# Patient Record
Sex: Female | Born: 1945 | Race: White | Hispanic: No | Smoking: Current every day smoker
Health system: Southern US, Community
[De-identification: ages and names within clinical notes are randomized; demographics above are authoritative.]

## PROBLEM LIST (undated history)

## (undated) DIAGNOSIS — J449 Chronic obstructive pulmonary disease, unspecified: Secondary | ICD-10-CM

## (undated) DIAGNOSIS — C641 Malignant neoplasm of right kidney, except renal pelvis: Secondary | ICD-10-CM

## (undated) DIAGNOSIS — I10 Essential (primary) hypertension: Secondary | ICD-10-CM

## (undated) DIAGNOSIS — I639 Cerebral infarction, unspecified: Secondary | ICD-10-CM

## (undated) DIAGNOSIS — F419 Anxiety disorder, unspecified: Secondary | ICD-10-CM

## (undated) DIAGNOSIS — J189 Pneumonia, unspecified organism: Secondary | ICD-10-CM

## (undated) DIAGNOSIS — M1990 Unspecified osteoarthritis, unspecified site: Secondary | ICD-10-CM

## (undated) DIAGNOSIS — F32A Depression, unspecified: Secondary | ICD-10-CM

## (undated) DIAGNOSIS — C189 Malignant neoplasm of colon, unspecified: Secondary | ICD-10-CM

## (undated) HISTORY — DX: Depression, unspecified: F32.A

## (undated) HISTORY — PX: NEPHRECTOMY: SHX65

## (undated) HISTORY — PX: CHOLECYSTECTOMY: SHX55

## (undated) HISTORY — PX: EYE SURGERY: SHX253

## (undated) HISTORY — PX: COLECTOMY: SHX59

## (undated) HISTORY — PX: CORONARY ANGIOPLASTY: SHX604

## (undated) HISTORY — PX: APPENDECTOMY: SHX54

---

## 2012-04-23 DIAGNOSIS — I2510 Atherosclerotic heart disease of native coronary artery without angina pectoris: Secondary | ICD-10-CM

## 2012-04-23 HISTORY — DX: Atherosclerotic heart disease of native coronary artery without angina pectoris: I25.10

## 2013-04-23 DIAGNOSIS — I219 Acute myocardial infarction, unspecified: Secondary | ICD-10-CM

## 2013-04-23 HISTORY — DX: Acute myocardial infarction, unspecified: I21.9

## 2019-12-23 DIAGNOSIS — K56609 Unspecified intestinal obstruction, unspecified as to partial versus complete obstruction: Secondary | ICD-10-CM

## 2019-12-23 HISTORY — DX: Unspecified intestinal obstruction, unspecified as to partial versus complete obstruction: K56.609

## 2020-01-05 ENCOUNTER — Other Ambulatory Visit: Payer: Self-pay

## 2020-01-05 ENCOUNTER — Inpatient Hospital Stay (HOSPITAL_COMMUNITY)
Admission: EM | Admit: 2020-01-05 | Discharge: 2020-01-12 | DRG: 395 | Disposition: A | Discharge status: Home / Self Care | Payer: Medicare Other | Attending: Family Medicine | Admitting: Family Medicine

## 2020-01-05 ENCOUNTER — Encounter (HOSPITAL_COMMUNITY): Payer: Self-pay | Admitting: Emergency Medicine

## 2020-01-05 DIAGNOSIS — K450 Other specified abdominal hernia with obstruction, without gangrene: Secondary | ICD-10-CM

## 2020-01-05 DIAGNOSIS — G8929 Other chronic pain: Secondary | ICD-10-CM | POA: Diagnosis present

## 2020-01-05 DIAGNOSIS — Z91041 Radiographic dye allergy status: Secondary | ICD-10-CM

## 2020-01-05 DIAGNOSIS — F419 Anxiety disorder, unspecified: Secondary | ICD-10-CM | POA: Diagnosis present

## 2020-01-05 DIAGNOSIS — E785 Hyperlipidemia, unspecified: Secondary | ICD-10-CM | POA: Diagnosis present

## 2020-01-05 DIAGNOSIS — C189 Malignant neoplasm of colon, unspecified: Secondary | ICD-10-CM | POA: Diagnosis present

## 2020-01-05 DIAGNOSIS — F17210 Nicotine dependence, cigarettes, uncomplicated: Secondary | ICD-10-CM | POA: Diagnosis present

## 2020-01-05 DIAGNOSIS — F17200 Nicotine dependence, unspecified, uncomplicated: Secondary | ICD-10-CM | POA: Diagnosis present

## 2020-01-05 DIAGNOSIS — Z85038 Personal history of other malignant neoplasm of large intestine: Secondary | ICD-10-CM

## 2020-01-05 DIAGNOSIS — I10 Essential (primary) hypertension: Secondary | ICD-10-CM | POA: Diagnosis present

## 2020-01-05 DIAGNOSIS — C641 Malignant neoplasm of right kidney, except renal pelvis: Secondary | ICD-10-CM | POA: Diagnosis present

## 2020-01-05 DIAGNOSIS — Z85528 Personal history of other malignant neoplasm of kidney: Secondary | ICD-10-CM

## 2020-01-05 DIAGNOSIS — J449 Chronic obstructive pulmonary disease, unspecified: Secondary | ICD-10-CM | POA: Diagnosis present

## 2020-01-05 DIAGNOSIS — I2510 Atherosclerotic heart disease of native coronary artery without angina pectoris: Secondary | ICD-10-CM | POA: Diagnosis present

## 2020-01-05 DIAGNOSIS — Z905 Acquired absence of kidney: Secondary | ICD-10-CM

## 2020-01-05 DIAGNOSIS — Z66 Do not resuscitate: Secondary | ICD-10-CM | POA: Diagnosis present

## 2020-01-05 DIAGNOSIS — Z7952 Long term (current) use of systemic steroids: Secondary | ICD-10-CM

## 2020-01-05 DIAGNOSIS — F418 Other specified anxiety disorders: Secondary | ICD-10-CM | POA: Diagnosis present

## 2020-01-05 DIAGNOSIS — Z8673 Personal history of transient ischemic attack (TIA), and cerebral infarction without residual deficits: Secondary | ICD-10-CM

## 2020-01-05 DIAGNOSIS — Z20822 Contact with and (suspected) exposure to covid-19: Secondary | ICD-10-CM | POA: Diagnosis present

## 2020-01-05 DIAGNOSIS — K420 Umbilical hernia with obstruction, without gangrene: Principal | ICD-10-CM | POA: Diagnosis present

## 2020-01-05 DIAGNOSIS — M545 Low back pain: Secondary | ICD-10-CM | POA: Diagnosis present

## 2020-01-05 DIAGNOSIS — K56609 Unspecified intestinal obstruction, unspecified as to partial versus complete obstruction: Secondary | ICD-10-CM | POA: Diagnosis present

## 2020-01-05 DIAGNOSIS — Z0189 Encounter for other specified special examinations: Secondary | ICD-10-CM

## 2020-01-05 DIAGNOSIS — I639 Cerebral infarction, unspecified: Secondary | ICD-10-CM | POA: Diagnosis present

## 2020-01-05 DIAGNOSIS — I252 Old myocardial infarction: Secondary | ICD-10-CM

## 2020-01-05 HISTORY — DX: Cerebral infarction, unspecified: I63.9

## 2020-01-05 HISTORY — DX: Essential (primary) hypertension: I10

## 2020-01-05 HISTORY — DX: Malignant neoplasm of right kidney, except renal pelvis: C64.1

## 2020-01-05 HISTORY — DX: Malignant neoplasm of colon, unspecified: C18.9

## 2020-01-05 HISTORY — DX: Anxiety disorder, unspecified: F41.9

## 2020-01-05 HISTORY — DX: Chronic obstructive pulmonary disease, unspecified: J44.9

## 2020-01-05 NOTE — ED Triage Notes (Signed)
Patient arrived with EMS from home reports mid/upper abdominal pain with nausea and bulging abdominal hernia this morning . No emesis or diarrhea , denies fever or chills .

## 2020-01-06 ENCOUNTER — Emergency Department (HOSPITAL_COMMUNITY): Payer: Medicare Other

## 2020-01-06 ENCOUNTER — Inpatient Hospital Stay (HOSPITAL_COMMUNITY): Payer: Medicare Other

## 2020-01-06 ENCOUNTER — Encounter (HOSPITAL_COMMUNITY): Payer: Self-pay | Admitting: Internal Medicine

## 2020-01-06 DIAGNOSIS — E785 Hyperlipidemia, unspecified: Secondary | ICD-10-CM | POA: Diagnosis present

## 2020-01-06 DIAGNOSIS — Z01810 Encounter for preprocedural cardiovascular examination: Secondary | ICD-10-CM

## 2020-01-06 DIAGNOSIS — K56609 Unspecified intestinal obstruction, unspecified as to partial versus complete obstruction: Secondary | ICD-10-CM

## 2020-01-06 DIAGNOSIS — I252 Old myocardial infarction: Secondary | ICD-10-CM | POA: Diagnosis not present

## 2020-01-06 DIAGNOSIS — I10 Essential (primary) hypertension: Secondary | ICD-10-CM | POA: Diagnosis present

## 2020-01-06 DIAGNOSIS — J449 Chronic obstructive pulmonary disease, unspecified: Secondary | ICD-10-CM | POA: Diagnosis present

## 2020-01-06 DIAGNOSIS — G8929 Other chronic pain: Secondary | ICD-10-CM | POA: Diagnosis present

## 2020-01-06 DIAGNOSIS — Z66 Do not resuscitate: Secondary | ICD-10-CM | POA: Diagnosis present

## 2020-01-06 DIAGNOSIS — C641 Malignant neoplasm of right kidney, except renal pelvis: Secondary | ICD-10-CM | POA: Diagnosis present

## 2020-01-06 DIAGNOSIS — I2510 Atherosclerotic heart disease of native coronary artery without angina pectoris: Secondary | ICD-10-CM

## 2020-01-06 DIAGNOSIS — F32A Depression, unspecified: Secondary | ICD-10-CM | POA: Diagnosis present

## 2020-01-06 DIAGNOSIS — K420 Umbilical hernia with obstruction, without gangrene: Secondary | ICD-10-CM | POA: Diagnosis present

## 2020-01-06 DIAGNOSIS — F17200 Nicotine dependence, unspecified, uncomplicated: Secondary | ICD-10-CM | POA: Diagnosis not present

## 2020-01-06 DIAGNOSIS — Z85038 Personal history of other malignant neoplasm of large intestine: Secondary | ICD-10-CM | POA: Diagnosis not present

## 2020-01-06 DIAGNOSIS — F17210 Nicotine dependence, cigarettes, uncomplicated: Secondary | ICD-10-CM | POA: Diagnosis present

## 2020-01-06 DIAGNOSIS — R1033 Periumbilical pain: Secondary | ICD-10-CM | POA: Diagnosis present

## 2020-01-06 DIAGNOSIS — Z905 Acquired absence of kidney: Secondary | ICD-10-CM | POA: Diagnosis not present

## 2020-01-06 DIAGNOSIS — Z20822 Contact with and (suspected) exposure to covid-19: Secondary | ICD-10-CM | POA: Diagnosis present

## 2020-01-06 DIAGNOSIS — Z8673 Personal history of transient ischemic attack (TIA), and cerebral infarction without residual deficits: Secondary | ICD-10-CM | POA: Diagnosis not present

## 2020-01-06 DIAGNOSIS — Z7952 Long term (current) use of systemic steroids: Secondary | ICD-10-CM | POA: Diagnosis not present

## 2020-01-06 DIAGNOSIS — F418 Other specified anxiety disorders: Secondary | ICD-10-CM | POA: Diagnosis present

## 2020-01-06 DIAGNOSIS — M545 Low back pain: Secondary | ICD-10-CM | POA: Diagnosis present

## 2020-01-06 DIAGNOSIS — I639 Cerebral infarction, unspecified: Secondary | ICD-10-CM | POA: Diagnosis present

## 2020-01-06 DIAGNOSIS — Z91041 Radiographic dye allergy status: Secondary | ICD-10-CM | POA: Diagnosis not present

## 2020-01-06 DIAGNOSIS — Z85528 Personal history of other malignant neoplasm of kidney: Secondary | ICD-10-CM | POA: Diagnosis not present

## 2020-01-06 DIAGNOSIS — F419 Anxiety disorder, unspecified: Secondary | ICD-10-CM | POA: Diagnosis not present

## 2020-01-06 DIAGNOSIS — C189 Malignant neoplasm of colon, unspecified: Secondary | ICD-10-CM | POA: Diagnosis present

## 2020-01-06 LAB — COMPREHENSIVE METABOLIC PANEL
ALT: 12 U/L (ref 0–44)
AST: 17 U/L (ref 15–41)
Albumin: 3.9 g/dL (ref 3.5–5.0)
Alkaline Phosphatase: 76 U/L (ref 38–126)
Anion gap: 10 (ref 5–15)
BUN: 13 mg/dL (ref 8–23)
CO2: 28 mmol/L (ref 22–32)
Calcium: 9.5 mg/dL (ref 8.9–10.3)
Chloride: 103 mmol/L (ref 98–111)
Creatinine, Ser: 0.85 mg/dL (ref 0.44–1.00)
GFR calc Af Amer: >60 mL/min (ref >60)
GFR calc non Af Amer: >60 mL/min (ref >60)
Glucose, Bld: 121 mg/dL — ABNORMAL HIGH (ref 70–99)
Potassium: 3.9 mmol/L (ref 3.5–5.1)
Sodium: 141 mmol/L (ref 135–145)
Total Bilirubin: 0.8 mg/dL (ref 0.3–1.2)
Total Protein: 7.4 g/dL (ref 6.5–8.1)

## 2020-01-06 LAB — CBC
HCT: 44.3 % (ref 36.0–46.0)
Hemoglobin: 14.1 g/dL (ref 12.0–15.0)
MCH: 32.3 pg (ref 26.0–34.0)
MCHC: 31.8 g/dL (ref 30.0–36.0)
MCV: 101.4 fL — ABNORMAL HIGH (ref 80.0–100.0)
Platelets: 380 10*3/uL (ref 150–400)
RBC: 4.37 MIL/uL (ref 3.87–5.11)
RDW: 12.9 % (ref 11.5–15.5)
WBC: 11.4 10*3/uL — ABNORMAL HIGH (ref 4.0–10.5)
nRBC: 0 % (ref 0.0–0.2)

## 2020-01-06 LAB — URINALYSIS, ROUTINE W REFLEX MICROSCOPIC
Bilirubin Urine: NEGATIVE
Glucose, UA: NEGATIVE mg/dL
Hgb urine dipstick: NEGATIVE
Ketones, ur: 5 mg/dL — AB
Leukocytes,Ua: NEGATIVE
Nitrite: NEGATIVE
Protein, ur: 30 mg/dL — AB
Specific Gravity, Urine: 1.027 (ref 1.005–1.030)
pH: 5 (ref 5.0–8.0)

## 2020-01-06 LAB — LACTIC ACID, PLASMA: Lactic Acid, Venous: 1.1 mmol/L (ref 0.5–1.9)

## 2020-01-06 LAB — SARS CORONAVIRUS 2 BY RT PCR (HOSPITAL ORDER, PERFORMED IN ~~LOC~~ HOSPITAL LAB): SARS Coronavirus 2: NEGATIVE

## 2020-01-06 LAB — BRAIN NATRIURETIC PEPTIDE: B Natriuretic Peptide: 90.6 pg/mL (ref 0.0–100.0)

## 2020-01-06 LAB — LIPASE, BLOOD: Lipase: 23 U/L (ref 11–51)

## 2020-01-06 MED ORDER — MORPHINE SULFATE (PF) 2 MG/ML IV SOLN: 2.0000 mg | INTRAVENOUS | Status: DC | PRN

## 2020-01-06 MED ORDER — ALBUTEROL SULFATE (2.5 MG/3ML) 0.083% IN NEBU: 3.0000 mL | INHALATION_SOLUTION | RESPIRATORY_TRACT | Status: DC | PRN

## 2020-01-06 MED ORDER — LORAZEPAM 2 MG/ML IJ SOLN
0.5000 mg | INTRAMUSCULAR | Status: DC | PRN
  Administered 2020-01-07: 0.5 mg via INTRAVENOUS
  Filled 2020-01-06: qty 1

## 2020-01-06 MED ORDER — FENTANYL CITRATE (PF) 100 MCG/2ML IJ SOLN
50.0000 ug | Freq: Once | INTRAMUSCULAR | Status: DC
  Filled 2020-01-06: qty 2

## 2020-01-06 MED ORDER — ONDANSETRON HCL 4 MG/2ML IJ SOLN
4.0000 mg | Freq: Four times a day (QID) | INTRAMUSCULAR | Status: DC | PRN
  Administered 2020-01-06 – 2020-01-09 (×2): 4 mg via INTRAVENOUS
  Filled 2020-01-06 (×2): qty 2

## 2020-01-06 MED ORDER — ONDANSETRON 4 MG PO TBDP
4.0000 mg | ORAL_TABLET | Freq: Four times a day (QID) | ORAL | Status: DC | PRN
  Administered 2020-01-09: 4 mg via ORAL
  Filled 2020-01-06: qty 1

## 2020-01-06 MED ORDER — PHENOL 1.4 % MT LIQD
1.0000 | OROMUCOSAL | Status: DC | PRN
  Administered 2020-01-06: 1 via OROMUCOSAL
  Filled 2020-01-06: qty 177

## 2020-01-06 MED ORDER — ACETAMINOPHEN 325 MG PO TABS
650.0000 mg | ORAL_TABLET | Freq: Four times a day (QID) | ORAL | Status: DC | PRN
  Administered 2020-01-07: 650 mg via ORAL
  Administered 2020-01-08: 325 mg via ORAL
  Administered 2020-01-10 – 2020-01-12 (×3): 650 mg via ORAL
  Filled 2020-01-06 (×6): qty 2

## 2020-01-06 MED ORDER — LACTATED RINGERS IV SOLN: INTRAVENOUS | Status: DC

## 2020-01-06 MED ORDER — ONDANSETRON HCL 4 MG/2ML IJ SOLN
4.0000 mg | Freq: Once | INTRAMUSCULAR | Status: AC
  Administered 2020-01-06: 4 mg via INTRAVENOUS
  Filled 2020-01-06: qty 2

## 2020-01-06 MED ORDER — HYDRALAZINE HCL 20 MG/ML IJ SOLN
10.0000 mg | INTRAMUSCULAR | Status: DC | PRN
  Administered 2020-01-11 – 2020-01-12 (×2): 10 mg via INTRAVENOUS
  Filled 2020-01-06 (×2): qty 1

## 2020-01-06 MED ORDER — POLYVINYL ALCOHOL 1.4 % OP SOLN
1.0000 [drp] | Freq: Every day | OPHTHALMIC | Status: DC
  Administered 2020-01-06 – 2020-01-12 (×6): 1 [drp] via OPHTHALMIC
  Filled 2020-01-06: qty 15

## 2020-01-06 MED ORDER — FLUTICASONE FUROATE-VILANTEROL 100-25 MCG/INH IN AEPB
1.0000 | INHALATION_SPRAY | Freq: Every day | RESPIRATORY_TRACT | Status: DC
  Filled 2020-01-06 (×2): qty 28

## 2020-01-06 MED ORDER — ACETAMINOPHEN 650 MG RE SUPP
650.0000 mg | Freq: Four times a day (QID) | RECTAL | Status: DC | PRN
  Administered 2020-01-07 (×2): 650 mg via RECTAL
  Filled 2020-01-06 (×2): qty 1

## 2020-01-06 MED ORDER — NICOTINE 21 MG/24HR TD PT24
21.0000 mg | MEDICATED_PATCH | Freq: Every day | TRANSDERMAL | Status: DC
  Filled 2020-01-06 (×5): qty 1

## 2020-01-06 NOTE — Consult Note (Addendum)
Christine Stuart 07/19/1945  [id number].    Requesting MD: Dr. Sherry Ruffing Chief Complaints: Abdominal pain, n/v Chief Complaint/Reason for Consult: SBO 2/2 periumbilical hernia   HPI: Christine Stuart is a 74 y.o. female with hx of CAD (prior stenting 2015), HTN, HLD, chronic low back pain on chronic steroids, chronic bronchitis, tobacco abuse who presented with acute onset of mid/upper abdominal pain.Patient reports this morning she began having severe, constant, mid/upper abdominal pain secondary to unreducible hernia.  She reports associated nausea without emesis.  Patient reported to [facility] for evaluation. She has noted increased frequency of hernia popping out but she is usually able to push it back in on her own. She has mild relief with IV pain medication. She underwent a CT A/P that showed severe proximal small bowel dilatation c/w obstruction which appears 2/2 large periumbilical hernia. More distal small bowel is non-dilated. This was unable to be reduced in the ED. General surgery was asked to see.     Patient has history of prior cholecystectomy, appendectomy, right nephrectomy, partial colectomy and tubal ligation. She is not on blood thinners. Patient reports she lives alone and pretty much all of her family is deceased. She has a daughter in law that she asked if I would update.   Patient reports allergy to almost all antibiotics, but reports she is able to take cipro.  ROS: Review of Systems  Constitutional: Negative for chills and fever.  Respiratory: Negative for shortness of breath and wheezing.   Cardiovascular: Negative for chest pain and palpitations.  Gastrointestinal: Positive for abdominal pain, constipation, nausea and vomiting.  Genitourinary: Negative for dysuria, frequency and urgency.  Musculoskeletal: Positive for back pain (chronic).  All other systems reviewed and are negative.   No family history on file.  History reviewed. No pertinent past medical  history.  History reviewed. No pertinent surgical history.  Social History:  reports that she has never smoked. She has never used smokeless tobacco. She reports that she does not drink alcohol and does not use drugs.  Allergies: No Known Allergies  (Not in a hospital admission)    Physical Exam: Blood pressure (!) 148/57, pulse 71, temperature 99.2 F (37.3 C), temperature source Oral, resp. rate 20, height 5' (1.524 m), weight 62 kg, SpO2 95 %. General: tearful, WD/WN white female who is laying in bed in NAD HEENT: head is normocephalic, atraumatic.  Sclera are noninjected.  PERRL.  Ears and nose without any masses or lesions.  Mouth is pink and moist. Dentition fair Heart: regular, rate, and rhythm.  Normal s1,s2. No obvious murmurs, gallops, or rubs noted.  Palpable pedal pulses bilaterally  Lungs: CTAB, no wheezes, rhonchi, or rales noted.  Respiratory effort nonlabored Abd: Soft, distension noted. Periumbilical hernia noted that is tender. The hernia is reducible but spontaneously returns. No peritonitis. Hypoactive bowel sounds. No masses organomegaly. Prior abdominal scars are well healed.  MS: no BUE/BLE edema, calves soft and nontender Skin: warm and dry with no masses, lesions, or rashes Psych: A&Ox4 with an appropriate affect Neuro: cranial nerves grossly intact, equal strength in BUE/BLE bilaterally, normal speech, though process intact  Results for orders placed or performed during the hospital encounter of 01/05/20 (from the past 48 hour(s))  Lipase, blood     Status: None   Collection Time: 01/06/20 12:05 AM  Result Value Ref Range   Lipase 23 11 - 51 U/L    Comment: Performed at [facility], [place] [address] [place], [place]  [address]  Comprehensive metabolic panel     Status: Abnormal   Collection Time: 01/06/20 12:05 AM  Result Value Ref Range   Sodium 141 135 - 145 mmol/L   Potassium 3.9 3.5 - 5.1 mmol/L   Chloride 103 98 - 111 mmol/L   CO2 28 22 - 32  mmol/L   Glucose, Bld 121 (H) 70 - 99 mg/dL    Comment: Glucose reference range applies only to samples taken after fasting for at least 8 hours.   BUN 13 8 - 23 mg/dL   Creatinine, Ser 0.85 0.44 - 1.00 mg/dL   Calcium 9.5 8.9 - 10.3 mg/dL   Total Protein 7.4 6.5 - 8.1 g/dL   Albumin 3.9 3.5 - 5.0 g/dL   AST 17 15 - 41 U/L   ALT 12 0 - 44 U/L   Alkaline Phosphatase 76 38 - 126 U/L   Total Bilirubin 0.8 0.3 - 1.2 mg/dL   GFR calc non Af Amer >60 >60 mL/min   GFR calc Af Amer >60 >60 mL/min   Anion gap 10 5 - 15    Comment: Performed at [facility], [place] [address] [place], [place] [address]  CBC     Status: Abnormal   Collection Time: 01/06/20 12:05 AM  Result Value Ref Range   WBC 11.4 (H) 4.0 - 10.5 K/uL   RBC 4.37 3.87 - 5.11 MIL/uL   Hemoglobin 14.1 12.0 - 15.0 g/dL   HCT 44.3 36 - 46 %   MCV 101.4 (H) 80.0 - 100.0 fL   MCH 32.3 26.0 - 34.0 pg   MCHC 31.8 30.0 - 36.0 g/dL   RDW 12.9 11.5 - 15.5 %   Platelets 380 150 - 400 K/uL   nRBC 0.0 0.0 - 0.2 %    Comment: Performed at [facility], [place] [address] [place], [place] [address]  Urinalysis, Routine w reflex microscopic     Status: Abnormal   Collection Time: 01/06/20  1:30 PM  Result Value Ref Range   Color, Urine AMBER (A) YELLOW    Comment: BIOCHEMICALS MAY BE AFFECTED BY COLOR   APPearance HAZY (A) CLEAR   Specific Gravity, Urine 1.027 1.005 - 1.030   pH 5.0 5.0 - 8.0   Glucose, UA NEGATIVE NEGATIVE mg/dL   Hgb urine dipstick NEGATIVE NEGATIVE   Bilirubin Urine NEGATIVE NEGATIVE   Ketones, ur 5 (A) NEGATIVE mg/dL   Protein, ur 30 (A) NEGATIVE mg/dL   Nitrite NEGATIVE NEGATIVE   Leukocytes,Ua NEGATIVE NEGATIVE   RBC / HPF 0-5 0 - 5 RBC/hpf   WBC, UA 6-10 0 - 5 WBC/hpf   Bacteria, UA FEW (A) NONE SEEN   Squamous Epithelial / LPF 0-5 0 - 5   Mucus PRESENT     Comment: Performed at [facility], [address] [place], [place] [address]  Lactic acid, plasma     Status: None   Collection  Time: 01/06/20  2:27 PM  Result Value Ref Range   Lactic Acid, Venous 1.1 0.5 - 1.9 mmol/L    Comment: Performed at [facility] [address] [place], [place] [address]  SARS Coronavirus 2 by RT PCR (hospital order, performed in [facility] hospital lab) Nasopharyngeal Nasopharyngeal Swab     Status: None   Collection Time: 01/06/20  2:28 PM   Specimen: Nasopharyngeal Swab  Result Value Ref Range   SARS Coronavirus 2 NEGATIVE NEGATIVE    Comment: (NOTE) SARS-CoV-2 target  nucleic acids are NOT DETECTED.  The SARS-CoV-2 RNA is generally detectable in upper and lower respiratory specimens during the acute phase of infection. The lowest concentration of SARS-CoV-2 viral copies this assay can detect is 250 copies / mL. A negative result does not preclude SARS-CoV-2 infection and should not be used as the sole basis for treatment or other patient management decisions.  A negative result may occur with improper specimen collection / handling, submission of specimen other than nasopharyngeal swab, presence of viral mutation(s) within the areas targeted by this assay, and inadequate number of viral copies (<250 copies / mL). A negative result must be combined with clinical observations, patient history, and epidemiological information.  Fact Sheet for Patients:   StrictlyIdeas.no  Fact Sheet for Healthcare Providers: BankingDealers.co.za  This test is not yet approved or  cleared by the [place] FDA and has been authorized for detection and/or diagnosis of SARS-CoV-2 by FDA under an Emergency Use Authorization (EUA).  This EUA will remain in effect (meaning this test can be used) for the duration of the COVID-19 declaration under Section 564(b)(1) of the Act, 21 U.S.C. section 360bbb-3(b)(1), unless the authorization is terminated or revoked sooner.  Performed at [facility], [place] [address], [place], [place] [address]     CT ABDOMEN PELVIS WO CONTRAST  Result Date: 01/06/2020 CLINICAL DATA:  Abdominal pain. EXAM: CT ABDOMEN AND PELVIS WITHOUT CONTRAST TECHNIQUE: Multidetector CT imaging of the abdomen and pelvis was performed following the standard protocol without IV contrast. COMPARISON:  June 06, 2017. FINDINGS: Lower chest: No acute abnormality. Hepatobiliary: Status post cholecystectomy. No biliary dilatation is noted. Stable large right renal cyst is noted. Pancreas: Unremarkable. No pancreatic ductal dilatation or surrounding inflammatory changes. Spleen: Normal in size without focal abnormality. Adrenals/Urinary Tract: Stable bilateral adrenal adenomas. Status post right nephrectomy. Left kidney and ureter are unremarkable. No hydronephrosis or renal obstruction is noted. Urinary bladder is decompressed. Stomach/Bowel: The stomach appears normal. No colonic dilatation is noted. Postsurgical changes are seen involving the right colon. Severe proximal small bowel dilatation is noted which appears to be due to small bowel loops within the large periumbilical hernia resulting in obstruction. More distal small bowel loops are nondilated. Vascular/Lymphatic: Aortic atherosclerosis. No enlarged abdominal or pelvic lymph nodes. Reproductive: Uterus and bilateral adnexa are unremarkable. Other: No ascites is noted.  No abnormal fluid collection is noted. Musculoskeletal: No acute or significant osseous findings. IMPRESSION: 1. Severe proximal small bowel dilatation is noted which appears to be due to small bowel loops within the large periumbilical hernia resulting in obstruction. More distal small bowel loops are nondilated. 2. Stable bilateral adrenal adenomas. 3. Aortic atherosclerosis. Aortic Atherosclerosis (ICD10-I70.0). Electronically Signed   By: Marijo Conception M.D.   On: 01/06/2020 14:51    Anti-infectives (From admission, onward)   None      Assessment/Plan Hx of CAD (prior stenting 2015) - previously  followed at [facility] HTN HLD Chronic low back pain on chronic steroids Chronic bronchitis Tobacco abuse  History of prior cholecystectomy, appendectomy, right nephrectomy, partial colectomy and tubal ligation  SBO 2/2 large periumbilical hernia - Recommend medical admission for patients multiple underlying medical problems  - Patients hernia is reducible but spontaneously returns. Will discuss with MD about conservative management w/ NGT decompression and observation overnight vs OR tonight. Her lactic acid is 1.1. WBC 11.4. VS currently stable without tachycardia or hypotension. In the interm, recommend NPO, NGT decompression.  - Further recs to follow  FEN - NPO, NGT VTE - SCDs,  ID - None currently   Patient reports allergy to almost all antibiotics, but reports she is able to take cipro.  Barkley Boards, [facility] Surgery 01/06/2020, 3:54 PM Please see Amion for pager number during day hours 7:00am-4:30pm
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
## 2020-01-06 NOTE — Consult Note (Addendum)
Cardiology Consultation:   Patient ID: Christine Stuart MRN: [id number]; DOB: 08/25/1945  Admit date: 01/05/2020 Date of Consult: 01/06/2020  Primary Care Provider: Patient, No Pcp Per [facility] Cardiologist: No primary care provider on file. new; seen in Register Electrophysiologist:  None    Patient Profile:   Christine Stuart is a 74 y.o. female with a hx of CAD, s/p PCI in 2015 who is being seen today for the evaluation of preoperative clearance at the request of Dr. Lorin Mercy.  History of Present Illness:   Christine Stuart has a history of coronary artery disease based on the prior record.  Prior to her stent in 2015, she had exertional angina.  It resolved after her stent.  She has not had any further anginal symptoms since that time.  She has had a very stressful 1 to 2 years with the death of a son and her husband.  She has used cigarettes to help her manage stress.  She has oxygen that she uses at home as needed.  She walks up an incline to get to her mailbox.  She denies any chest discomfort with that activity.  She does have some dyspnea on exertion.  She does admit that she does not do any regular exercise.  There are no stairs in her house.  Overall, she remains independent and is able to complete all of her daily activities as needed.   Past Medical History:  Diagnosis Date  . Anxiety and depression   . CAD (coronary artery disease) 2014  . Cancer of kidney, right ([place])   . Colon cancer ([place])   . COPD (chronic obstructive pulmonary disease) ([place])   . CVA (cerebral vascular accident) ([place])   . Hypertension     Past Surgical History:  Procedure Laterality Date  . COLECTOMY    . NEPHRECTOMY         Inpatient Medications: Scheduled Meds: . nicotine  21 mg Transdermal Daily   Continuous Infusions: . lactated ringers     PRN Meds: acetaminophen **OR** acetaminophen, albuterol, hydrALAZINE, LORazepam, morphine injection, ondansetron **OR** ondansetron  (ZOFRAN) IV  Allergies:    Allergies  Allergen Reactions  . Codeine Swelling  . Erythromycin Hives, Itching, Shortness Of Breath and Swelling  . Iodinated Diagnostic Agents Hives, Itching, Shortness Of Breath and Swelling  . Other Swelling and Other (See Comments)    Propoxyphene N-acetaminophen  . Penicillins Hives, Itching, Shortness Of Breath and Swelling  . Sulfa Antibiotics Hives and Shortness Of Breath  . Influenza Vaccines Nausea And Vomiting and Other (See Comments)    Fever; Pt stated she was sick for days  . Naproxen Swelling and Other (See Comments)    Tongue and throat  . Oxycodone-Acetaminophen Swelling  . Pneumococcal Vaccine Nausea And Vomiting    Social History:   Social History   Socioeconomic History  . Marital status: Widowed    Spouse name: Not on file  . Number of children: Not on file  . Years of education: Not on file  . Highest education level: Not on file  Occupational History  . Not on file  Tobacco Use  . Smoking status: Current Every Day Smoker    Packs/day: 1.00    Years: 60.00    Pack years: 60.00  . Smokeless tobacco: Never Used  Substance and Sexual Activity  . Alcohol use: Never  . Drug use: Never  . Sexual activity: Not on file  Other Topics Concern  . Not on file  Social History Narrative  . Not on file   Social Determinants of Health   Financial Resource Strain:   . Difficulty of Paying Living Expenses: Not on file  Food Insecurity:   . Worried About Charity fundraiser in the Last Year: Not on file  . Ran Out of Food in the Last Year: Not on file  Transportation Needs:   . Lack of Transportation (Medical): Not on file  . Lack of Transportation (Non-Medical): Not on file  Physical Activity:   . Days of Exercise per Week: Not on file  . Minutes of Exercise per Session: Not on file  Stress:   . Feeling of Stress : Not on file  Social Connections:   . Frequency of Communication with Friends and Family: Not on file  .  Frequency of Social Gatherings with Friends and Family: Not on file  . Attends Religious Services: Not on file  . Active Member of Clubs or Organizations: Not on file  . Attends Archivist Meetings: Not on file  . Marital Status: Not on file  Intimate Partner Violence:   . Fear of Current or Ex-Partner: Not on file  . Emotionally Abused: Not on file  . Physically Abused: Not on file  . Sexually Abused: Not on file    Family History:   Mother with heart disease.  Maternal grandmother had colon cancer.  ROS:  Please see the history of present illness.  Significant for abdominal pain and chronic dyspnea on exertion All other ROS reviewed and negative.     Physical Exam/Data:   Vitals:   01/06/20 0941 01/06/20 1335 01/06/20 1400 01/06/20 1500  BP: (!) 157/57 (!) 157/60 (!) 161/52 (!) 148/57  Pulse: 66 84 77 71  Resp: 16 17 20 20   Temp: 99.2 F (37.3 C)     TempSrc: Oral     SpO2: 96% 98% 98% 95%  Weight:      Height:       No intake or output data in the 24 hours ending 01/06/20 1835 Last 3 Weights 01/05/2020  Weight (lbs) 136 lb 11 oz  Weight (kg) 62 kg     Body mass index is 26.69 kg/m.  General:  Well nourished, well developed, in no acute distress HEENT: normal Lymph: no adenopathy Neck: no JVD Endocrine:  No thryomegaly Vascular: No carotid bruits; FA pulses 2+ bilaterally without bruits  Cardiac:  normal S1, S2; RRR; no murmur  Lungs:  clear to auscultation bilaterally, no wheezing, rhonchi or rales  Abd: soft, nontender, no hepatomegaly  Ext: no edema Musculoskeletal:  No deformities, BUE and BLE strength normal and equal Skin: warm and dry  Neuro:  CNs 2-12 intact, no focal abnormalities noted Psych:  Normal affect   EKG:  The EKG was personally reviewed and demonstrates:  NSR, no ST segment changes Telemetry:  Telemetry was personally reviewed and demonstrates:  NSR   Relevant CV Studies: Prior cath records reviewed showed single vessel  stent  Laboratory Data:  High Sensitivity Troponin:  No results for input(s): TROPONINIHS in the last 720 hours.   Chemistry Recent Labs  Lab 01/06/20 0005  NA 141  K 3.9  CL 103  CO2 28  GLUCOSE 121*  BUN 13  CREATININE 0.85  CALCIUM 9.5  GFRNONAA >60  GFRAA >60  ANIONGAP 10    Recent Labs  Lab 01/06/20 0005  PROT 7.4  ALBUMIN 3.9  AST 17  ALT 12  ALKPHOS 76  BILITOT 0.8  Hematology Recent Labs  Lab 01/06/20 0005  WBC 11.4*  RBC 4.37  HGB 14.1  HCT 44.3  MCV 101.4*  MCH 32.3  MCHC 31.8  RDW 12.9  PLT 380   BNPNo results for input(s): BNP, PROBNP in the last 168 hours.  DDimer No results for input(s): DDIMER in the last 168 hours.   Radiology/Studies:  CT ABDOMEN PELVIS WO CONTRAST  Result Date: 01/06/2020 CLINICAL DATA:  Abdominal pain. EXAM: CT ABDOMEN AND PELVIS WITHOUT CONTRAST TECHNIQUE: Multidetector CT imaging of the abdomen and pelvis was performed following the standard protocol without IV contrast. COMPARISON:  June 06, 2017. FINDINGS: Lower chest: No acute abnormality. Hepatobiliary: Status post cholecystectomy. No biliary dilatation is noted. Stable large right renal cyst is noted. Pancreas: Unremarkable. No pancreatic ductal dilatation or surrounding inflammatory changes. Spleen: Normal in size without focal abnormality. Adrenals/Urinary Tract: Stable bilateral adrenal adenomas. Status post right nephrectomy. Left kidney and ureter are unremarkable. No hydronephrosis or renal obstruction is noted. Urinary bladder is decompressed. Stomach/Bowel: The stomach appears normal. No colonic dilatation is noted. Postsurgical changes are seen involving the right colon. Severe proximal small bowel dilatation is noted which appears to be due to small bowel loops within the large periumbilical hernia resulting in obstruction. More distal small bowel loops are nondilated. Vascular/Lymphatic: Aortic atherosclerosis. No enlarged abdominal or pelvic lymph nodes.  Reproductive: Uterus and bilateral adnexa are unremarkable. Other: No ascites is noted.  No abnormal fluid collection is noted. Musculoskeletal: No acute or significant osseous findings. IMPRESSION: 1. Severe proximal small bowel dilatation is noted which appears to be due to small bowel loops within the large periumbilical hernia resulting in obstruction. More distal small bowel loops are nondilated. 2. Stable bilateral adrenal adenomas. 3. Aortic atherosclerosis. Aortic Atherosclerosis (ICD10-I70.0). Electronically Signed   By: Marijo Conception M.D.   On: 01/06/2020 14:51          Assessment and Plan:   1. Preoperative evaluation: No signs of acute ischemia.  No signs of acute heart failure.  I discussed the case with Dr. Inda Merlin.  The surgery is urgent and will likely be done tomorrow.  No cardiac testing is needed at this time.  Given her age and prior history of heart problems, she has an intermediate risk of cardiac complications during the perioperative period,  approximately 5%.  At this point, nothing can really be done to lower that risk.  Will deal with any potential complications as they arise.  We will follow along. 2. Coronary artery disease: She has been only on aspirin of late for antiplatelet therapy.  She has not taken her aspirin in the last couple of days due to the stomach upset related to her small bowel obstruction.  Long-term, she will need her atorvastatin. 3. Hypertension: Once blood pressure stabilized after surgery, restart lisinopril.  Renal function appeared stable. 4. She needs to stop smoking.       For questions or updates, please contact [place] Please consult [email or web address] for contact info under    Signed, Larae Grooms, MD  01/06/2020 6:35 PM

## 2020-01-06 NOTE — ED Provider Notes (Signed)
[facility] EMERGENCY DEPARTMENT Provider Note   CSN: [id number] Arrival date & time: 01/05/20  2351     History Chief Complaint  Patient presents with  . Abdominal Pain    Christine Stuart is a 74 y.o. female.  The history is provided by the patient and medical records. No language interpreter was used.  Abdominal Pain Pain location:  Periumbilical Pain quality: aching and sharp   Pain quality: not bloating   Pain radiates to:  Does not radiate Pain severity:  Severe Onset quality:  Gradual Duration:  2 days Timing:  Constant Progression:  Worsening Chronicity:  New Context: previous surgery   Relieved by:  Nothing Worsened by:  Palpation Ineffective treatments:  None tried Associated symptoms: belching and nausea   Associated symptoms: no anorexia, no chest pain, no chills, no cough, no diarrhea, no dysuria, no fatigue, no fever, no flatus, no shortness of breath and no vomiting   Risk factors: multiple surgeries        History reviewed. No pertinent past medical history.  There are no problems to display for this patient.   History reviewed. No pertinent surgical history.   OB History   No obstetric history on file.     No family history on file.  Social History   Tobacco Use  . Smoking status: Never Smoker  . Smokeless tobacco: Never Used  Substance Use Topics  . Alcohol use: Never  . Drug use: Never    Home Medications Prior to Admission medications   Not on File    Allergies    Patient has no known allergies.  Review of Systems   Review of Systems  Constitutional: Negative for chills, diaphoresis, fatigue and fever.  HENT: Negative for congestion.   Eyes: Negative for visual disturbance.  Respiratory: Negative for cough, chest tightness, shortness of breath and wheezing.   Cardiovascular: Negative for chest pain and palpitations.  Gastrointestinal: Positive for abdominal distention, abdominal pain and nausea. Negative  for anorexia, diarrhea, flatus and vomiting.  Genitourinary: Negative for dysuria and frequency.  Musculoskeletal: Negative for back pain, neck pain and neck stiffness.  Skin: Negative for wound.  Neurological: Negative for headaches.  Psychiatric/Behavioral: Negative for agitation.  All other systems reviewed and are negative.   Physical Exam Updated Vital Signs BP (!) 157/60   Pulse 84   Temp 99.2 F (37.3 C) (Oral)   Resp 17   Ht 5' (1.524 m)   Wt 62 kg   SpO2 98%   BMI 26.69 kg/m   Physical Exam Vitals and nursing note reviewed.  Constitutional:      General: She is not in acute distress.    Appearance: She is well-developed. She is not ill-appearing, toxic-appearing or diaphoretic.  HENT:     Head: Normocephalic and atraumatic.     Right Ear: External ear normal.     Left Ear: External ear normal.     Nose: Nose normal.     Mouth/Throat:     Mouth: Mucous membranes are moist.     Pharynx: No pharyngeal swelling or oropharyngeal exudate.  Eyes:     Extraocular Movements: Extraocular movements intact.     Conjunctiva/sclera: Conjunctivae normal.     Pupils: Pupils are equal, round, and reactive to light.  Cardiovascular:     Rate and Rhythm: Normal rate and regular rhythm.     Heart sounds: Normal heart sounds. No murmur heard.   Pulmonary:     Effort: Pulmonary effort is normal.  No respiratory distress.     Breath sounds: No stridor. No wheezing, rhonchi or rales.  Chest:     Chest wall: No tenderness.  Abdominal:     General: Bowel sounds are normal. There is distension.     Tenderness: There is abdominal tenderness. There is guarding. There is no right CVA tenderness, left CVA tenderness or rebound.     Hernia: A hernia is present. Hernia is present in the ventral area.    Musculoskeletal:     Cervical back: Normal range of motion and neck supple.  Skin:    General: Skin is warm.     Findings: No erythema or rash.  Neurological:     General: No focal  deficit present.     Mental Status: She is alert and oriented to person, place, and time.     Motor: No abnormal muscle tone.     Coordination: Coordination normal.     Deep Tendon Reflexes: Reflexes are normal and symmetric.  Psychiatric:        Mood and Affect: Mood normal.     ED Results / Procedures / Treatments   Labs (all labs ordered are listed, but only abnormal results are displayed) Labs Reviewed  COMPREHENSIVE METABOLIC PANEL - Abnormal; Notable for the following components:      Result Value   Glucose, Bld 121 (*)    All other components within normal limits  CBC - Abnormal; Notable for the following components:   WBC 11.4 (*)    MCV 101.4 (*)    All other components within normal limits  URINALYSIS, ROUTINE W REFLEX MICROSCOPIC - Abnormal; Notable for the following components:   Color, Urine AMBER (*)    APPearance HAZY (*)    Ketones, ur 5 (*)    Protein, ur 30 (*)    Bacteria, UA FEW (*)    All other components within normal limits  SARS CORONAVIRUS 2 BY RT PCR (HOSPITAL ORDER, [facility])  LIPASE, BLOOD  LACTIC ACID, PLASMA  BASIC METABOLIC PANEL  CBC    EKG None  Radiology CT ABDOMEN PELVIS WO CONTRAST  Result Date: 01/06/2020 CLINICAL DATA:  Abdominal pain. EXAM: CT ABDOMEN AND PELVIS WITHOUT CONTRAST TECHNIQUE: Multidetector CT imaging of the abdomen and pelvis was performed following the standard protocol without IV contrast. COMPARISON:  June 06, 2017. FINDINGS: Lower chest: No acute abnormality. Hepatobiliary: Status post cholecystectomy. No biliary dilatation is noted. Stable large right renal cyst is noted. Pancreas: Unremarkable. No pancreatic ductal dilatation or surrounding inflammatory changes. Spleen: Normal in size without focal abnormality. Adrenals/Urinary Tract: Stable bilateral adrenal adenomas. Status post right nephrectomy. Left kidney and ureter are unremarkable. No hydronephrosis or renal obstruction is  noted. Urinary bladder is decompressed. Stomach/Bowel: The stomach appears normal. No colonic dilatation is noted. Postsurgical changes are seen involving the right colon. Severe proximal small bowel dilatation is noted which appears to be due to small bowel loops within the large periumbilical hernia resulting in obstruction. More distal small bowel loops are nondilated. Vascular/Lymphatic: Aortic atherosclerosis. No enlarged abdominal or pelvic lymph nodes. Reproductive: Uterus and bilateral adnexa are unremarkable. Other: No ascites is noted.  No abnormal fluid collection is noted. Musculoskeletal: No acute or significant osseous findings. IMPRESSION: 1. Severe proximal small bowel dilatation is noted which appears to be due to small bowel loops within the large periumbilical hernia resulting in obstruction. More distal small bowel loops are nondilated. 2. Stable bilateral adrenal adenomas. 3. Aortic atherosclerosis. Aortic  Atherosclerosis (ICD10-I70.0). Electronically Signed   By: Marijo Conception M.D.   On: 01/06/2020 14:51    Procedures Procedures (including critical care time)  CRITICAL CARE Performed by: Gwenyth Allegra Claudy Abdallah Total critical care time: 30 minutes Critical care time was exclusive of separately billable procedures and treating other patients. Critical care was necessary to treat or prevent imminent or life-threatening deterioration. Critical care was time spent personally by me on the following activities: development of treatment plan with patient and/or surrogate as well as nursing, discussions with consultants, evaluation of patient's response to treatment, examination of patient, obtaining history from patient or surrogate, ordering and performing treatments and interventions, ordering and review of laboratory studies, ordering and review of radiographic studies, pulse oximetry and re-evaluation of patient's condition.   Medications Ordered in ED Medications  lactated ringers  infusion (has no administration in time range)  acetaminophen (TYLENOL) tablet 650 mg (has no administration in time range)    Or  acetaminophen (TYLENOL) suppository 650 mg (has no administration in time range)  morphine 2 MG/ML injection 2 mg (has no administration in time range)  ondansetron (ZOFRAN-ODT) disintegrating tablet 4 mg (has no administration in time range)    Or  ondansetron (ZOFRAN) injection 4 mg (has no administration in time range)  nicotine (NICODERM CQ - dosed in mg/24 hours) patch 21 mg (has no administration in time range)  hydrALAZINE (APRESOLINE) injection 10 mg (has no administration in time range)  ondansetron (ZOFRAN) injection 4 mg (4 mg Intravenous Given 01/06/20 1424)    ED Course  I have reviewed the triage vital signs and the nursing notes.  Pertinent labs & imaging results that were available during my care of the patient were reviewed by me and considered in my medical decision making (see chart for details).    MDM Rules/Calculators/A&P                          Jenea Dake is a 74 y.o. female who has a past medical history significant for cancer status post partial colectomy and nephrectomy, cholecystectomy, CAD with MI, stroke, and COPD who presents for abdominal pain.  She reports that she has had abdominal hernia for some time after abdominal surgeries however she says that for the last day and a half, she has had severe abdominal pain that is not getting better.  She reports when she fell on her abdomen, she had a ball that was very hard and she could not reduce it.  She also had nausea but no vomiting.  She has not passed gas or had a bowel movement since the onset of severe pain.  She describes the pain as 10 out of 10.  Patient waited for over 12 hours in the emergency department prior to me evaluating her and ordering imaging.  She denies fevers, chills, chest pain, shortness of breath, or palpitations.  On exam, abdomen is very tender and has what  feels like a hernia.  Patient will not let me push out due to the severe tenderness and I did hear bowel sounds in the hernia.  Lungs clear and chest nontender.  Exam otherwise unremarkable.  Patient will have CT scan to look for incarcerated hernia in this area of tenderness.  Patient had screening labs showing mild leukocytosis.  She will have a Covid swab as well.  She was given pain medicine and nausea medicine.  CT scan returned showing evidence of hernia with severe small bowel  obstruction.  General surgery was called who will see her.  They requested NG tube placement and remaining n.p.o.  They will likely take the patient to the OR at some point but they would like her be to be admitted to medicine due to her other medical problems.  Internal medicine will see the patient for admission.   Final Clinical Impression(s) / ED Diagnoses Final diagnoses:  Intestinal obstruction, unspecified cause, unspecified whether partial or complete ([place])  Other specified abdominal hernia with obstruction and without gangrene    Clinical Impression: 1. Intestinal obstruction, unspecified cause, unspecified whether partial or complete ([place])   2. Other specified abdominal hernia with obstruction and without gangrene     Disposition: Admit  This note was prepared with assistance of Dragon voice recognition software. Occasional wrong-word or sound-a-like substitutions may have occurred due to the inherent limitations of voice recognition software.     Cresta Riden, Gwenyth Allegra, MD 01/06/20 1728

## 2020-01-06 NOTE — ED Notes (Signed)
Daughter-in-law, Judie Petit [phone] would like updates.

## 2020-01-07 ENCOUNTER — Inpatient Hospital Stay (HOSPITAL_COMMUNITY): Payer: Medicare Other

## 2020-01-07 ENCOUNTER — Encounter (HOSPITAL_COMMUNITY): Payer: Self-pay | Admitting: Internal Medicine

## 2020-01-07 DIAGNOSIS — F419 Anxiety disorder, unspecified: Secondary | ICD-10-CM

## 2020-01-07 DIAGNOSIS — F329 Major depressive disorder, single episode, unspecified: Secondary | ICD-10-CM

## 2020-01-07 LAB — CBC
HCT: 39.6 % (ref 36.0–46.0)
Hemoglobin: 12.7 g/dL (ref 12.0–15.0)
MCH: 32.3 pg (ref 26.0–34.0)
MCHC: 32.1 g/dL (ref 30.0–36.0)
MCV: 100.8 fL — ABNORMAL HIGH (ref 80.0–100.0)
Platelets: 343 10*3/uL (ref 150–400)
RBC: 3.93 MIL/uL (ref 3.87–5.11)
RDW: 12.6 % (ref 11.5–15.5)
WBC: 11.6 10*3/uL — ABNORMAL HIGH (ref 4.0–10.5)
nRBC: 0 % (ref 0.0–0.2)

## 2020-01-07 LAB — BASIC METABOLIC PANEL
Anion gap: 9 (ref 5–15)
BUN: 24 mg/dL — ABNORMAL HIGH (ref 8–23)
CO2: 27 mmol/L (ref 22–32)
Calcium: 8.8 mg/dL — ABNORMAL LOW (ref 8.9–10.3)
Chloride: 104 mmol/L (ref 98–111)
Creatinine, Ser: 0.75 mg/dL (ref 0.44–1.00)
GFR calc Af Amer: >60 mL/min (ref >60)
GFR calc non Af Amer: >60 mL/min (ref >60)
Glucose, Bld: 89 mg/dL (ref 70–99)
Potassium: 3.9 mmol/L (ref 3.5–5.1)
Sodium: 140 mmol/L (ref 135–145)

## 2020-01-07 MED ORDER — DIATRIZOATE MEGLUMINE & SODIUM 66-10 % PO SOLN: 90.0000 mL | Freq: Once | ORAL | Status: DC

## 2020-01-07 MED ORDER — PREDNISONE 50 MG PO TABS
50.0000 mg | ORAL_TABLET | Freq: Four times a day (QID) | ORAL | Status: AC
  Administered 2020-01-07 – 2020-01-08 (×3): 50 mg via ORAL
  Filled 2020-01-07 (×3): qty 1

## 2020-01-07 MED ORDER — DIATRIZOATE MEGLUMINE & SODIUM 66-10 % PO SOLN
90.0000 mL | Freq: Once | ORAL | Status: AC
  Administered 2020-01-08: 90 mL via ORAL
  Filled 2020-01-07: qty 90

## 2020-01-07 MED ORDER — PANTOPRAZOLE SODIUM 40 MG IV SOLR
40.0000 mg | INTRAVENOUS | Status: DC
  Administered 2020-01-07 – 2020-01-10 (×4): 40 mg via INTRAVENOUS
  Filled 2020-01-07 (×4): qty 40

## 2020-01-07 MED ORDER — DIPHENHYDRAMINE HCL 25 MG PO CAPS: 50.0000 mg | ORAL_CAPSULE | Freq: Once | ORAL | Status: AC

## 2020-01-07 MED ORDER — DIPHENHYDRAMINE HCL 50 MG/ML IJ SOLN
50.0000 mg | Freq: Once | INTRAMUSCULAR | Status: AC
  Administered 2020-01-08: 50 mg via INTRAVENOUS
  Filled 2020-01-07: qty 1

## 2020-01-07 NOTE — Plan of Care (Signed)

## 2020-01-07 NOTE — Progress Notes (Signed)
Progress Note  Patient Name: Christine Stuart Date of Encounter: 01/07/2020  [place] Cardiologist: No primary care provider on file. seen in [place]  Subjective   Complains of throat irritation after NG tube, but stomach feels better.  Inpatient Medications    Scheduled Meds: . fluticasone furoate-vilanterol  1 puff Inhalation Daily  . nicotine  21 mg Transdermal Daily  . pantoprazole (PROTONIX) IV  40 mg Intravenous Q24H  . polyvinyl alcohol  1 drop Both Eyes Daily   Continuous Infusions: . lactated ringers 100 mL/hr at 01/07/20 0649   PRN Meds: acetaminophen **OR** acetaminophen, albuterol, hydrALAZINE, LORazepam, morphine injection, ondansetron **OR** ondansetron (ZOFRAN) IV, phenol   Vital Signs    Vitals:   01/07/20 0209 01/07/20 0456 01/07/20 0749 01/07/20 0816  BP: (!) 150/45 (!) 157/48 (!) 164/132 (!) 164/50  Pulse: 75 77 79 72  Resp: 18 17    Temp: 98.1 F (36.7 C) 98 F (36.7 C) 98.6 F (37 C) 98.6 F (37 C)  TempSrc: Oral Oral    SpO2: 94% 92% 92% 92%  Weight:      Height:        Intake/Output Summary (Last 24 hours) at 01/07/2020 1010 Last data filed at 01/06/2020 2200 Gross per 24 hour  Intake 197.92 ml  Output --  Net 197.92 ml   Last 3 Weights 01/05/2020  Weight (lbs) 136 lb 11 oz  Weight (kg) 62 kg      Telemetry    Not on tele - Personally Reviewed  ECG    NSR yesterday - Personally Reviewed  Physical Exam   GEN: No acute distress.   Neck: No JVD Cardiac: RRR, no murmurs, rubs, or gallops.  Respiratory: Clear to auscultation bilaterally. GI: hernia present  MS: No edema; No deformity. Neuro:  Nonfocal  Psych: Normal affect   Labs    High Sensitivity Troponin:  No results for input(s): TROPONINIHS in the last 720 hours.    Chemistry Recent Labs  Lab 01/06/20 0005 01/07/20 0507  NA 141 140  K 3.9 3.9  CL 103 104  CO2 28 27  GLUCOSE 121* 89  BUN 13 24*  CREATININE 0.85 0.75  CALCIUM 9.5 8.8*  PROT 7.4  --    ALBUMIN 3.9  --   AST 17  --   ALT 12  --   ALKPHOS 76  --   BILITOT 0.8  --   GFRNONAA >60 >60  GFRAA >60 >60  ANIONGAP 10 9     Hematology Recent Labs  Lab 01/06/20 0005 01/07/20 0507  WBC 11.4* 11.6*  RBC 4.37 3.93  HGB 14.1 12.7  HCT 44.3 39.6  MCV 101.4* 100.8*  MCH 32.3 32.3  MCHC 31.8 32.1  RDW 12.9 12.6  PLT 380 343    BNP Recent Labs  Lab 01/06/20 1948  BNP 90.6     DDimer No results for input(s): DDIMER in the last 168 hours.   Radiology    CT ABDOMEN PELVIS WO CONTRAST  Result Date: 01/06/2020 CLINICAL DATA:  Abdominal pain. EXAM: CT ABDOMEN AND PELVIS WITHOUT CONTRAST TECHNIQUE: Multidetector CT imaging of the abdomen and pelvis was performed following the standard protocol without IV contrast. COMPARISON:  June 06, 2017. FINDINGS: Lower chest: No acute abnormality. Hepatobiliary: Status post cholecystectomy. No biliary dilatation is noted. Stable large right renal cyst is noted. Pancreas: Unremarkable. No pancreatic ductal dilatation or surrounding inflammatory changes. Spleen: Normal in size without focal abnormality. Adrenals/Urinary Tract: Stable bilateral adrenal adenomas.  Status post right nephrectomy. Left kidney and ureter are unremarkable. No hydronephrosis or renal obstruction is noted. Urinary bladder is decompressed. Stomach/Bowel: The stomach appears normal. No colonic dilatation is noted. Postsurgical changes are seen involving the right colon. Severe proximal small bowel dilatation is noted which appears to be due to small bowel loops within the large periumbilical hernia resulting in obstruction. More distal small bowel loops are nondilated. Vascular/Lymphatic: Aortic atherosclerosis. No enlarged abdominal or pelvic lymph nodes. Reproductive: Uterus and bilateral adnexa are unremarkable. Other: No ascites is noted.  No abnormal fluid collection is noted. Musculoskeletal: No acute or significant osseous findings. IMPRESSION: 1. Severe proximal  small bowel dilatation is noted which appears to be due to small bowel loops within the large periumbilical hernia resulting in obstruction. More distal small bowel loops are nondilated. 2. Stable bilateral adrenal adenomas. 3. Aortic atherosclerosis. Aortic Atherosclerosis (ICD10-I70.0). Electronically Signed   By: Marijo Conception M.D.   On: 01/06/2020 14:51   DG Abd Portable 1V-Small Bowel Protocol-Position Verification  Result Date: 01/07/2020 CLINICAL DATA:  Nasogastric tube placement. EXAM: PORTABLE ABDOMEN - 1 VIEW COMPARISON:  January 06, 2020. FINDINGS: Mildly dilated small bowel loops are noted concerning for distal small bowel obstruction. No colonic dilatation is noted. Distal tip of nasogastric tube is just beyond the gastroesophageal junction with side hole in the distal esophagus. This is not significantly changed compared to prior exam. IMPRESSION: Distal tip of nasogastric tube is just beyond the gastroesophageal junction with side hole in distal esophagus. This is not significantly changed compared to prior exam. Mildly dilated small bowel loops are noted concerning for distal small bowel obstruction. Electronically Signed   By: Marijo Conception M.D.   On: 01/07/2020 09:26   DG Abd Portable 1 View  Result Date: 01/06/2020 CLINICAL DATA:  NG tube placement EXAM: PORTABLE ABDOMEN - 1 VIEW COMPARISON:  None. FINDINGS: The bowel gas pattern is normal. Tip the NG tube is seen within the stomach the side hole appears to be the distal esophagus. Surgical clips seen in the mid abdomen. IMPRESSION: Tip the NG tube just entering the stomach and side likely within the distal esophagus. Electronically Signed   By: Prudencio Pair M.D.   On: 01/06/2020 19:14    Cardiac Studies     Patient Profile     74 y.o. female with likely need for surgery to repair SBO  Assessment & Plan    CAD: No angina.  No CHF.  No further testing needed before surgery.     For questions or updates, please contact  [place] Please consult [email or web address] for contact info under        Signed, Larae Grooms, MD  01/07/2020, 10:10 AM

## 2020-01-07 NOTE — Progress Notes (Signed)
PROGRESS NOTE    Christine Stuart  ONG:[id number] DOB: 16-Mar-1946 DOA: 01/05/2020 PCP: Patient, No Pcp Per   Brief Narrative: Christine Stuart is a 74 y.o. female with medical history significant of HTN; anxiety-depression; kidney and colon CA; COPD on prn home O2; CAD/CVA; and tobacco dependence. Patient presented secondary to abdominal pain and found to have an SBO. NG tube placed and general surgery consulted for co-management.   Assessment & Plan:   Principal Problem:   SBO (small bowel obstruction) (HCC) Active Problems:   Hypertension   CVA (cerebral vascular accident) ([place])   COPD (chronic obstructive pulmonary disease) ([place])   Colon cancer (HCC)   Cancer of kidney, right (HCC)   CAD (coronary artery disease)   Anxiety and depression   Tobacco dependence   DNR (do not resuscitate)   Small bowel obstruction NG tube placed on admission. General surgery consulted. Complicated by abdominal hernia. Intermediate risk per cardiology -General surgery recommendations  History of CVA/CAD -Continue Lipitor once NG tube discontinued  History of renal cell/colon cancer Prior history. Not currently on treatment.  Essential hypertension -Continue IV hydralazine  COPD On prednisone as an outpatient which was held on admission. -Watch BP  Anxiety Depression Prozac and bupropion held on admission secondary to NPO. Xanax switched to Ativan IV.   DVT prophylaxis: SCDs Code Status:   Code Status: DNR Family Communication: None at bedside Disposition Plan: Discharge likely in several days to home vs SNF pending continued general surgery recommendations for management of SBO   Consultants:   General surgery  Cardiology  Procedures:   None  Antimicrobials:  None    Subjective: Abdominal pain. Improved from admission but not controlled.  Objective: Vitals:   01/06/20 1500 01/06/20 1938 01/07/20 0209 01/07/20 0456  BP: (!) 148/57 (!) 169/58 (!) 150/45 (!) 157/48    Pulse: 71 75 75 77  Resp: 20  18 17   Temp:  98.7 F (37.1 C) 98.1 F (36.7 C) 98 F (36.7 C)  TempSrc:  Oral Oral Oral  SpO2: 95% 96% 94% 92%  Weight:      Height:        Intake/Output Summary (Last 24 hours) at 01/07/2020 0740 Last data filed at 01/06/2020 2200 Gross per 24 hour  Intake 197.92 ml  Output --  Net 197.92 ml   Filed Weights   01/05/20 2357  Weight: 62 kg    Examination:  General exam: Appears calm and comfortable Respiratory system: Clear to auscultation. Respiratory effort normal. Cardiovascular system: S1 & S2 heard, RRR. No murmurs, rubs, gallops or clicks. Gastrointestinal system: Abdomen is distended, soft and generally nontender. Abdominal hernia present which is tender. Normal bowel sounds heard. Central nervous system: Alert and oriented. No focal neurological deficits. Musculoskeletal: No edema. No calf tenderness Skin: No cyanosis. No rashes Psychiatry: Judgement and insight appear normal. Mood & affect appropriate.     Data Reviewed: I have personally reviewed following labs and imaging studies  CBC Lab Results  Component Value Date   WBC 11.6 (H) 01/07/2020   RBC 3.93 01/07/2020   HGB 12.7 01/07/2020   HCT 39.6 01/07/2020   MCV 100.8 (H) 01/07/2020   MCH 32.3 01/07/2020   PLT 343 01/07/2020   MCHC 32.1 01/07/2020   RDW 12.6 44/04/270     Last metabolic panel Lab Results  Component Value Date   NA 140 01/07/2020   K 3.9 01/07/2020   CL 104 01/07/2020   CO2 27 01/07/2020   BUN 24 (H)  01/07/2020   CREATININE 0.75 01/07/2020   GLUCOSE 89 01/07/2020   GFRNONAA >60 01/07/2020   GFRAA >60 01/07/2020   CALCIUM 8.8 (L) 01/07/2020   PROT 7.4 01/06/2020   ALBUMIN 3.9 01/06/2020   BILITOT 0.8 01/06/2020   ALKPHOS 76 01/06/2020   AST 17 01/06/2020   ALT 12 01/06/2020   ANIONGAP 9 01/07/2020    CBG (last 3)  No results for input(s): GLUCAP in the last 72 hours.   GFR: Estimated Creatinine Clearance: 50.7 mL/min (by C-G  formula based on SCr of 0.75 mg/dL).  Coagulation Profile: No results for input(s): INR, PROTIME in the last 168 hours.  Recent Results (from the past 240 hour(s))  SARS Coronavirus 2 by RT PCR (hospital order, performed in [facility] hospital lab) Nasopharyngeal Nasopharyngeal Swab     Status: None   Collection Time: 01/06/20  2:28 PM   Specimen: Nasopharyngeal Swab  Result Value Ref Range Status   SARS Coronavirus 2 NEGATIVE NEGATIVE Final    Comment: (NOTE) SARS-CoV-2 target nucleic acids are NOT DETECTED.  The SARS-CoV-2 RNA is generally detectable in upper and lower respiratory specimens during the acute phase of infection. The lowest concentration of SARS-CoV-2 viral copies this assay can detect is 250 copies / mL. A negative result does not preclude SARS-CoV-2 infection and should not be used as the sole basis for treatment or other patient management decisions.  A negative result may occur with improper specimen collection / handling, submission of specimen other than nasopharyngeal swab, presence of viral mutation(s) within the areas targeted by this assay, and inadequate number of viral copies (<250 copies / mL). A negative result must be combined with clinical observations, patient history, and epidemiological information.  Fact Sheet for Patients:   StrictlyIdeas.no  Fact Sheet for Healthcare Providers: BankingDealers.co.za  This test is not yet approved or  cleared by the [place] FDA and has been authorized for detection and/or diagnosis of SARS-CoV-2 by FDA under an Emergency Use Authorization (EUA).  This EUA will remain in effect (meaning this test can be used) for the duration of the COVID-19 declaration under Section 564(b)(1) of the Act, 21 U.S.C. section 360bbb-3(b)(1), unless the authorization is terminated or revoked sooner.  Performed at [facility], [place] [address], [place], [place] [address]          Radiology Studies: CT ABDOMEN PELVIS WO CONTRAST  Result Date: 01/06/2020 CLINICAL DATA:  Abdominal pain. EXAM: CT ABDOMEN AND PELVIS WITHOUT CONTRAST TECHNIQUE: Multidetector CT imaging of the abdomen and pelvis was performed following the standard protocol without IV contrast. COMPARISON:  June 06, 2017. FINDINGS: Lower chest: No acute abnormality. Hepatobiliary: Status post cholecystectomy. No biliary dilatation is noted. Stable large right renal cyst is noted. Pancreas: Unremarkable. No pancreatic ductal dilatation or surrounding inflammatory changes. Spleen: Normal in size without focal abnormality. Adrenals/Urinary Tract: Stable bilateral adrenal adenomas. Status post right nephrectomy. Left kidney and ureter are unremarkable. No hydronephrosis or renal obstruction is noted. Urinary bladder is decompressed. Stomach/Bowel: The stomach appears normal. No colonic dilatation is noted. Postsurgical changes are seen involving the right colon. Severe proximal small bowel dilatation is noted which appears to be due to small bowel loops within the large periumbilical hernia resulting in obstruction. More distal small bowel loops are nondilated. Vascular/Lymphatic: Aortic atherosclerosis. No enlarged abdominal or pelvic lymph nodes. Reproductive: Uterus and bilateral adnexa are unremarkable. Other: No ascites is noted.  No abnormal fluid collection is noted. Musculoskeletal: No acute or significant osseous  findings. IMPRESSION: 1. Severe proximal small bowel dilatation is noted which appears to be due to small bowel loops within the large periumbilical hernia resulting in obstruction. More distal small bowel loops are nondilated. 2. Stable bilateral adrenal adenomas. 3. Aortic atherosclerosis. Aortic Atherosclerosis (ICD10-I70.0). Electronically Signed   By: Marijo Conception M.D.   On: 01/06/2020 14:51   DG Abd Portable 1 View  Result Date: 01/06/2020 CLINICAL DATA:  NG tube placement EXAM:  PORTABLE ABDOMEN - 1 VIEW COMPARISON:  None. FINDINGS: The bowel gas pattern is normal. Tip the NG tube is seen within the stomach the side hole appears to be the distal esophagus. Surgical clips seen in the mid abdomen. IMPRESSION: Tip the NG tube just entering the stomach and side likely within the distal esophagus. Electronically Signed   By: Prudencio Pair M.D.   On: 01/06/2020 19:14        Scheduled Meds:  fluticasone furoate-vilanterol  1 puff Inhalation Daily   nicotine  21 mg Transdermal Daily   polyvinyl alcohol  1 drop Both Eyes Daily   Continuous Infusions:  lactated ringers 100 mL/hr at 01/07/20 0649     LOS: 1 day     Cordelia Poche, MD [facility] 01/07/2020, 7:40 AM  If 7PM-7AM, please contact night-coverage [email or web address]

## 2020-01-07 NOTE — Progress Notes (Signed)
Talked to [place] - pt's daughter-in-law about the gastrografin procedure and confirmed that patient agreed to do the gastrografin.  Patient as well confirmed verbally to do the gastrografin.  Also talked to Pharmacist, [place] about the premedication needed prior to this procedure (3 doses of prednisone tabs and benadryl) which are timed accordingly per medication administration order.  Will closely monitor patient during this procedure and will endorse appropriately to day shift RN.

## 2020-01-07 NOTE — Progress Notes (Signed)
[facility] Progress Note     Subjective: Patient reports hernia is softer and less tender this AM. She denies flatus or BM. She still is hopeful to avoid surgery. She had a past reaction to IV contrast which was severe but thinks she may have tolerated PO contrast in the past. However she is afraid of having a reaction and would like to hold off on gastrografin for now. Her daughter in law is coming in later today and they will discuss.   Objective: Vital signs in last 24 hours: Temp:  [98 F (36.7 C)-99.2 F (37.3 C)] 98.6 F (37 C) (09/16 0816) Pulse Rate:  [66-84] 72 (09/16 0816) Resp:  [16-20] 17 (09/16 0456) BP: (148-169)/(45-132) 164/50 (09/16 0816) SpO2:  [92 %-98 %] 92 % (09/16 0816) Last BM Date: 01/04/20  Intake/Output from previous day: 09/15 0701 - 09/16 0700 In: 197.9 [I.V.:197.9] Out: -  Intake/Output this shift: No intake/output data recorded.  PE: General: pleasant, WD, overweight female who is laying in bed in NAD Heart: regular, rate, and rhythm.  Normal s1,s2. No obvious murmurs, gallops, or rubs noted.  Palpable radial and pedal pulses bilaterally Lungs: CTAB, no wheezes, rhonchi, or rales noted.  Respiratory effort nonlabored Abd: soft, mildly ttp around hernia, ND, +BS, hernia is soft and reducible  MS: all 4 extremities are symmetrical with no cyanosis, clubbing, or edema. Skin: warm and dry with no masses, lesions, or rashes Neuro: Cranial nerves 2-12 grossly intact, sensation grossly intact Psych: A&Ox3 with an appropriate affect.   Lab Results:  Recent Labs    01/06/20 0005 01/07/20 0507  WBC 11.4* 11.6*  HGB 14.1 12.7  HCT 44.3 39.6  PLT 380 343   BMET Recent Labs    01/06/20 0005 01/07/20 0507  NA 141 140  K 3.9 3.9  CL 103 104  CO2 28 27  GLUCOSE 121* 89  BUN 13 24*  CREATININE 0.85 0.75  CALCIUM 9.5 8.8*   PT/INR No results for input(s): LABPROT, INR in the last 72 hours. CMP     Component Value Date/Time    NA 140 01/07/2020 0507   K 3.9 01/07/2020 0507   CL 104 01/07/2020 0507   CO2 27 01/07/2020 0507   GLUCOSE 89 01/07/2020 0507   BUN 24 (H) 01/07/2020 0507   CREATININE 0.75 01/07/2020 0507   CALCIUM 8.8 (L) 01/07/2020 0507   PROT 7.4 01/06/2020 0005   ALBUMIN 3.9 01/06/2020 0005   AST 17 01/06/2020 0005   ALT 12 01/06/2020 0005   ALKPHOS 76 01/06/2020 0005   BILITOT 0.8 01/06/2020 0005   GFRNONAA >60 01/07/2020 0507   GFRAA >60 01/07/2020 0507   Lipase     Component Value Date/Time   LIPASE 23 01/06/2020 0005       Studies/Results: CT ABDOMEN PELVIS WO CONTRAST  Result Date: 01/06/2020 CLINICAL DATA:  Abdominal pain. EXAM: CT ABDOMEN AND PELVIS WITHOUT CONTRAST TECHNIQUE: Multidetector CT imaging of the abdomen and pelvis was performed following the standard protocol without IV contrast. COMPARISON:  June 06, 2017. FINDINGS: Lower chest: No acute abnormality. Hepatobiliary: Status post cholecystectomy. No biliary dilatation is noted. Stable large right renal cyst is noted. Pancreas: Unremarkable. No pancreatic ductal dilatation or surrounding inflammatory changes. Spleen: Normal in size without focal abnormality. Adrenals/Urinary Tract: Stable bilateral adrenal adenomas. Status post right nephrectomy. Left kidney and ureter are unremarkable. No hydronephrosis or renal obstruction is noted. Urinary bladder is decompressed. Stomach/Bowel: The stomach appears normal. No colonic dilatation is noted.  Postsurgical changes are seen involving the right colon. Severe proximal small bowel dilatation is noted which appears to be due to small bowel loops within the large periumbilical hernia resulting in obstruction. More distal small bowel loops are nondilated. Vascular/Lymphatic: Aortic atherosclerosis. No enlarged abdominal or pelvic lymph nodes. Reproductive: Uterus and bilateral adnexa are unremarkable. Other: No ascites is noted.  No abnormal fluid collection is noted. Musculoskeletal: No  acute or significant osseous findings. IMPRESSION: 1. Severe proximal small bowel dilatation is noted which appears to be due to small bowel loops within the large periumbilical hernia resulting in obstruction. More distal small bowel loops are nondilated. 2. Stable bilateral adrenal adenomas. 3. Aortic atherosclerosis. Aortic Atherosclerosis (ICD10-I70.0). Electronically Signed   By: Marijo Conception M.D.   On: 01/06/2020 14:51   DG Abd Portable 1 View  Result Date: 01/06/2020 CLINICAL DATA:  NG tube placement EXAM: PORTABLE ABDOMEN - 1 VIEW COMPARISON:  None. FINDINGS: The bowel gas pattern is normal. Tip the NG tube is seen within the stomach the side hole appears to be the distal esophagus. Surgical clips seen in the mid abdomen. IMPRESSION: Tip the NG tube just entering the stomach and side likely within the distal esophagus. Electronically Signed   By: Prudencio Pair M.D.   On: 01/06/2020 19:14    Anti-infectives: Anti-infectives (From admission, onward)   None       Assessment/Plan Hx of CAD (prior stenting 2015) - previously followed at [facility] HTN HLD Chronic low back pain on chronic steroids Chronic bronchitis Tobacco abuse  History of prior cholecystectomy, appendectomy, right nephrectomy, partial colectomy and tubal ligation  SBO 2/2 large periumbilical hernia - Patients hernia is soft and reducible this AM but still somewhat tender - would like to give patient small bowel protocol this AM but she currently does not want to take the PO gastrografin - I have notified RN to let us know if she changes her mind later today - continue NGT on LIWS for now, mobilize as able  - await return in bowel function   FEN - NPO, NGT to LIWS VTE - SCDs,  ID - None currently    LOS: 1 day    Norm Parcel , [facility] Surgery 01/07/2020, 9:09 AM Please see Amion for pager number during day hours 7:00am-4:30pm

## 2020-01-08 ENCOUNTER — Inpatient Hospital Stay (HOSPITAL_COMMUNITY): Payer: Medicare Other

## 2020-01-08 MED ORDER — DIPHENHYDRAMINE HCL 50 MG/ML IJ SOLN: 25.0000 mg | Freq: Three times a day (TID) | INTRAMUSCULAR | Status: DC | PRN

## 2020-01-08 NOTE — Progress Notes (Signed)
NGT advanced 10 cm per Radiology as in  abdominal X ray result.

## 2020-01-08 NOTE — Progress Notes (Signed)
Per Dr. Bobbye Morton, NGT clamped unless patient becomes nauseated.

## 2020-01-08 NOTE — Progress Notes (Signed)
Patient given gastrografin and tube is clamped. Will unclamp in 1 hour. No complications at this time. Will continue to monitor.

## 2020-01-08 NOTE — Progress Notes (Signed)
Patient refuses BREO. I crediting med yesterday, and it was sent again. SHe stated she will not take it, just Albuterol as needed.

## 2020-01-08 NOTE — Plan of Care (Signed)
  Problem: Education: Goal: Knowledge of General Education information will improve Description Including pain rating scale, medication(s)/side effects and non-pharmacologic comfort measures Outcome: Progressing   

## 2020-01-08 NOTE — Progress Notes (Signed)
[facility] Progress Note     Subjective: Patient did not know if she had passed flatus but denied BM, per RN patient was observed passing flatus on BSC this AM. Patient reports hernia is soft and pain is much better. She is agreeable to try SBO protocol today. She is very concerned what the plan is going to be regarding hernia because she does not want this to happen again.   Objective: Vital signs in last 24 hours: Temp:  [98.3 F (36.8 C)-98.5 F (36.9 C)] 98.5 F (36.9 C) (09/17 0506) Pulse Rate:  [68-80] 80 (09/17 0506) Resp:  [17] 17 (09/17 0506) BP: (161-168)/(52-64) 161/64 (09/17 0506) SpO2:  [93 %-95 %] 93 % (09/17 0506) Last BM Date: 01/04/20  Intake/Output from previous day: 09/16 0701 - 09/17 0700 In: 2393.8 [I.V.:2333.8; NG/GT:60] Out: 1100 [Emesis/NG output:1100] Intake/Output this shift: No intake/output data recorded.  PE: General: pleasant, WD, overweight female who is laying in bed in NAD Heart: regular, rate, and rhythm.  Normal s1,s2. No obvious murmurs, gallops, or rubs noted.  Palpable radial and pedal pulses bilaterally Lungs: CTAB, no wheezes, rhonchi, or rales noted.  Respiratory effort nonlabored Abd: soft, mildly ttp around hernia, ND, +BS, hernia is soft and reducible  MS: all 4 extremities are symmetrical with no cyanosis, clubbing, or edema. Skin: warm and dry with no masses, lesions, or rashes Neuro: Cranial nerves 2-12 grossly intact, sensation grossly intact Psych: A&Ox3 with an appropriate affect.   Lab Results:  Recent Labs    01/06/20 0005 01/07/20 0507  WBC 11.4* 11.6*  HGB 14.1 12.7  HCT 44.3 39.6  PLT 380 343   BMET Recent Labs    01/06/20 0005 01/07/20 0507  NA 141 140  K 3.9 3.9  CL 103 104  CO2 28 27  GLUCOSE 121* 89  BUN 13 24*  CREATININE 0.85 0.75  CALCIUM 9.5 8.8*   PT/INR No results for input(s): LABPROT, INR in the last 72 hours. CMP     Component Value Date/Time   NA 140 01/07/2020 0507    K 3.9 01/07/2020 0507   CL 104 01/07/2020 0507   CO2 27 01/07/2020 0507   GLUCOSE 89 01/07/2020 0507   BUN 24 (H) 01/07/2020 0507   CREATININE 0.75 01/07/2020 0507   CALCIUM 8.8 (L) 01/07/2020 0507   PROT 7.4 01/06/2020 0005   ALBUMIN 3.9 01/06/2020 0005   AST 17 01/06/2020 0005   ALT 12 01/06/2020 0005   ALKPHOS 76 01/06/2020 0005   BILITOT 0.8 01/06/2020 0005   GFRNONAA >60 01/07/2020 0507   GFRAA >60 01/07/2020 0507   Lipase     Component Value Date/Time   LIPASE 23 01/06/2020 0005       Studies/Results: CT ABDOMEN PELVIS WO CONTRAST  Result Date: 01/06/2020 CLINICAL DATA:  Abdominal pain. EXAM: CT ABDOMEN AND PELVIS WITHOUT CONTRAST TECHNIQUE: Multidetector CT imaging of the abdomen and pelvis was performed following the standard protocol without IV contrast. COMPARISON:  June 06, 2017. FINDINGS: Lower chest: No acute abnormality. Hepatobiliary: Status post cholecystectomy. No biliary dilatation is noted. Stable large right renal cyst is noted. Pancreas: Unremarkable. No pancreatic ductal dilatation or surrounding inflammatory changes. Spleen: Normal in size without focal abnormality. Adrenals/Urinary Tract: Stable bilateral adrenal adenomas. Status post right nephrectomy. Left kidney and ureter are unremarkable. No hydronephrosis or renal obstruction is noted. Urinary bladder is decompressed. Stomach/Bowel: The stomach appears normal. No colonic dilatation is noted. Postsurgical changes are seen involving the right colon. Severe proximal  small bowel dilatation is noted which appears to be due to small bowel loops within the large periumbilical hernia resulting in obstruction. More distal small bowel loops are nondilated. Vascular/Lymphatic: Aortic atherosclerosis. No enlarged abdominal or pelvic lymph nodes. Reproductive: Uterus and bilateral adnexa are unremarkable. Other: No ascites is noted.  No abnormal fluid collection is noted. Musculoskeletal: No acute or significant  osseous findings. IMPRESSION: 1. Severe proximal small bowel dilatation is noted which appears to be due to small bowel loops within the large periumbilical hernia resulting in obstruction. More distal small bowel loops are nondilated. 2. Stable bilateral adrenal adenomas. 3. Aortic atherosclerosis. Aortic Atherosclerosis (ICD10-I70.0). Electronically Signed   By: Marijo Conception M.D.   On: 01/06/2020 14:51   DG Abd Portable 1V-Small Bowel Protocol-Position Verification  Result Date: 01/07/2020 CLINICAL DATA:  Nasogastric tube placement. EXAM: PORTABLE ABDOMEN - 1 VIEW COMPARISON:  January 06, 2020. FINDINGS: Mildly dilated small bowel loops are noted concerning for distal small bowel obstruction. No colonic dilatation is noted. Distal tip of nasogastric tube is just beyond the gastroesophageal junction with side hole in the distal esophagus. This is not significantly changed compared to prior exam. IMPRESSION: Distal tip of nasogastric tube is just beyond the gastroesophageal junction with side hole in distal esophagus. This is not significantly changed compared to prior exam. Mildly dilated small bowel loops are noted concerning for distal small bowel obstruction. Electronically Signed   By: Marijo Conception M.D.   On: 01/07/2020 09:26   DG Abd Portable 1 View  Result Date: 01/06/2020 CLINICAL DATA:  NG tube placement EXAM: PORTABLE ABDOMEN - 1 VIEW COMPARISON:  None. FINDINGS: The bowel gas pattern is normal. Tip the NG tube is seen within the stomach the side hole appears to be the distal esophagus. Surgical clips seen in the mid abdomen. IMPRESSION: Tip the NG tube just entering the stomach and side likely within the distal esophagus. Electronically Signed   By: Prudencio Pair M.D.   On: 01/06/2020 19:14    Anti-infectives: Anti-infectives (From admission, onward)   None       Assessment/Plan Hx ofCAD (prior stenting 2015)- previously followed at [facility] HTN HLD Chronic low back pain on  chronic steroids Chronic bronchitis Tobacco abuse History of prior cholecystectomy,appendectomy, rightnephrectomy, partial colectomy and tubal ligation  SBO 2/2 large periumbilical hernia - Patients hernia is soft and reducible this AM, less tender - SBO protocol this AM, has received prednisone and benadryl - primary concern is bowel function, next concern is what needs to be done for hernia - will discuss further with MD - continue NGT on LIWS for now, mobilize as able  - await return in bowel function   FEN -NPO, NGT to LIWS VTE -SCDs,ok to have chemical prophylaxis from a surgical standpoint ID -None currently   LOS: 2 days    Norm Parcel , [facility] 01/08/2020, 9:44 AM Please see Amion for pager number during day hours 7:00am-4:30pm

## 2020-01-08 NOTE — Progress Notes (Signed)
PROGRESS NOTE    Christine Stuart  GBT:[id number] DOB: 11-May-1945 DOA: 01/05/2020 PCP: Patient, No Pcp Per   Brief Narrative: Christine Stuart is a 74 y.o. female with medical history significant of HTN; anxiety-depression; kidney and colon CA; COPD on prn home O2; CAD/CVA; and tobacco dependence. Patient presented secondary to abdominal pain and found to have an SBO. NG tube placed and general surgery consulted for co-management.   Assessment & Plan:   Principal Problem:   SBO (small bowel obstruction) (HCC) Active Problems:   Hypertension   CVA (cerebral vascular accident) ([place])   COPD (chronic obstructive pulmonary disease) ([place])   Colon cancer (HCC)   Cancer of kidney, right (HCC)   CAD (coronary artery disease)   Anxiety and depression   Tobacco dependence   DNR (do not resuscitate)   Small bowel obstruction NG tube placed on admission. General surgery consulted. Complicated by abdominal hernia. Intermediate risk per cardiology -General surgery recommendations: Gastrografin protocol (patient pre-medicated for history of allergic reaction to iodine)  History of CVA/CAD -Continue Lipitor once NG tube discontinued  History of renal cell/colon cancer Prior history. Not currently on treatment.  Essential hypertension -Continue IV hydralazine prn  COPD On prednisone as an outpatient which was held on admission. -Watch BP for hypotension  Anxiety Depression Prozac and bupropion held on admission secondary to NPO. Xanax switched to Ativan IV.   DVT prophylaxis: SCDs Code Status:   Code Status: DNR Family Communication: Daughter at bedside Disposition Plan: Discharge likely in several days to home vs SNF pending continued general surgery recommendations for management of SBO   Consultants:   General surgery  Cardiology  Procedures:   None  Antimicrobials:  None    Subjective: Still with pain. States it is better but hernia appears to be bothering her more.  Has passed some gas.  Objective: Vitals:   01/07/20 0749 01/07/20 0816 01/07/20 2202 01/08/20 0506  BP: (!) 164/132 (!) 164/50 (!) 168/52 (!) 161/64  Pulse: 79 72 68 80  Resp:   17 17  Temp: 98.6 F (37 C) 98.6 F (37 C) 98.3 F (36.8 C) 98.5 F (36.9 C)  TempSrc:   Oral Oral  SpO2: 92% 92% 95% 93%  Weight:      Height:        Intake/Output Summary (Last 24 hours) at 01/08/2020 1434 Last data filed at 01/08/2020 0639 Gross per 24 hour  Intake 2393.76 ml  Output 1100 ml  Net 1293.76 ml   Filed Weights   01/05/20 2357  Weight: 62 kg    Examination:  General exam: Appears calm and comfortable Respiratory system: Clear to auscultation. Respiratory effort normal. Cardiovascular system: S1 & S2 heard, RRR. No murmurs, rubs, gallops or clicks. Gastrointestinal system: Abdomen is distended, soft and generally tender with guarding. Abdominal hernia which is tender. Decreased bowel sounds heard. Central nervous system: Alert and oriented. No focal neurological deficits. Musculoskeletal: No edema. No calf tenderness Skin: No cyanosis. No rashes Psychiatry: Judgement and insight appear normal. Mood & affect appropriate.     Data Reviewed: I have personally reviewed following labs and imaging studies  CBC Lab Results  Component Value Date   WBC 11.6 (H) 01/07/2020   RBC 3.93 01/07/2020   HGB 12.7 01/07/2020   HCT 39.6 01/07/2020   MCV 100.8 (H) 01/07/2020   MCH 32.3 01/07/2020   PLT 343 01/07/2020   MCHC 32.1 01/07/2020   RDW 12.6 71/09/2692     Last metabolic panel Lab Results  Component Value Date   NA 140 01/07/2020   K 3.9 01/07/2020   CL 104 01/07/2020   CO2 27 01/07/2020   BUN 24 (H) 01/07/2020   CREATININE 0.75 01/07/2020   GLUCOSE 89 01/07/2020   GFRNONAA >60 01/07/2020   GFRAA >60 01/07/2020   CALCIUM 8.8 (L) 01/07/2020   PROT 7.4 01/06/2020   ALBUMIN 3.9 01/06/2020   BILITOT 0.8 01/06/2020   ALKPHOS 76 01/06/2020   AST 17 01/06/2020   ALT 12  01/06/2020   ANIONGAP 9 01/07/2020    CBG (last 3)  No results for input(s): GLUCAP in the last 72 hours.   GFR: Estimated Creatinine Clearance: 50.7 mL/min (by C-G formula based on SCr of 0.75 mg/dL).  Coagulation Profile: No results for input(s): INR, PROTIME in the last 168 hours.  Recent Results (from the past 240 hour(s))  SARS Coronavirus 2 by RT PCR (hospital order, performed in [facility] hospital lab) Nasopharyngeal Nasopharyngeal Swab     Status: None   Collection Time: 01/06/20  2:28 PM   Specimen: Nasopharyngeal Swab  Result Value Ref Range Status   SARS Coronavirus 2 NEGATIVE NEGATIVE Final    Comment: (NOTE) SARS-CoV-2 target nucleic acids are NOT DETECTED.  The SARS-CoV-2 RNA is generally detectable in upper and lower respiratory specimens during the acute phase of infection. The lowest concentration of SARS-CoV-2 viral copies this assay can detect is 250 copies / mL. A negative result does not preclude SARS-CoV-2 infection and should not be used as the sole basis for treatment or other patient management decisions.  A negative result may occur with improper specimen collection / handling, submission of specimen other than nasopharyngeal swab, presence of viral mutation(s) within the areas targeted by this assay, and inadequate number of viral copies (<250 copies / mL). A negative result must be combined with clinical observations, patient history, and epidemiological information.  Fact Sheet for Patients:   StrictlyIdeas.no  Fact Sheet for Healthcare Providers: BankingDealers.co.za  This test is not yet approved or  cleared by the [place] FDA and has been authorized for detection and/or diagnosis of SARS-CoV-2 by FDA under an Emergency Use Authorization (EUA).  This EUA will remain in effect (meaning this test can be used) for the duration of the COVID-19 declaration under Section 564(b)(1) of the Act, 21  U.S.C. section 360bbb-3(b)(1), unless the authorization is terminated or revoked sooner.  Performed at [facility], [place] [address], [place], [place] [address]         Radiology Studies: CT ABDOMEN PELVIS WO CONTRAST  Result Date: 01/06/2020 CLINICAL DATA:  Abdominal pain. EXAM: CT ABDOMEN AND PELVIS WITHOUT CONTRAST TECHNIQUE: Multidetector CT imaging of the abdomen and pelvis was performed following the standard protocol without IV contrast. COMPARISON:  June 06, 2017. FINDINGS: Lower chest: No acute abnormality. Hepatobiliary: Status post cholecystectomy. No biliary dilatation is noted. Stable large right renal cyst is noted. Pancreas: Unremarkable. No pancreatic ductal dilatation or surrounding inflammatory changes. Spleen: Normal in size without focal abnormality. Adrenals/Urinary Tract: Stable bilateral adrenal adenomas. Status post right nephrectomy. Left kidney and ureter are unremarkable. No hydronephrosis or renal obstruction is noted. Urinary bladder is decompressed. Stomach/Bowel: The stomach appears normal. No colonic dilatation is noted. Postsurgical changes are seen involving the right colon. Severe proximal small bowel dilatation is noted which appears to be due to small bowel loops within the large periumbilical hernia resulting in obstruction. More distal small bowel loops are nondilated. Vascular/Lymphatic: Aortic atherosclerosis. No enlarged abdominal or  pelvic lymph nodes. Reproductive: Uterus and bilateral adnexa are unremarkable. Other: No ascites is noted.  No abnormal fluid collection is noted. Musculoskeletal: No acute or significant osseous findings. IMPRESSION: 1. Severe proximal small bowel dilatation is noted which appears to be due to small bowel loops within the large periumbilical hernia resulting in obstruction. More distal small bowel loops are nondilated. 2. Stable bilateral adrenal adenomas. 3. Aortic atherosclerosis. Aortic Atherosclerosis (ICD10-I70.0).  Electronically Signed   By: Marijo Conception M.D.   On: 01/06/2020 14:51   DG Abd Portable 1V-Small Bowel Protocol-Position Verification  Result Date: 01/07/2020 CLINICAL DATA:  Nasogastric tube placement. EXAM: PORTABLE ABDOMEN - 1 VIEW COMPARISON:  January 06, 2020. FINDINGS: Mildly dilated small bowel loops are noted concerning for distal small bowel obstruction. No colonic dilatation is noted. Distal tip of nasogastric tube is just beyond the gastroesophageal junction with side hole in the distal esophagus. This is not significantly changed compared to prior exam. IMPRESSION: Distal tip of nasogastric tube is just beyond the gastroesophageal junction with side hole in distal esophagus. This is not significantly changed compared to prior exam. Mildly dilated small bowel loops are noted concerning for distal small bowel obstruction. Electronically Signed   By: Marijo Conception M.D.   On: 01/07/2020 09:26   DG Abd Portable 1 View  Result Date: 01/06/2020 CLINICAL DATA:  NG tube placement EXAM: PORTABLE ABDOMEN - 1 VIEW COMPARISON:  None. FINDINGS: The bowel gas pattern is normal. Tip the NG tube is seen within the stomach the side hole appears to be the distal esophagus. Surgical clips seen in the mid abdomen. IMPRESSION: Tip the NG tube just entering the stomach and side likely within the distal esophagus. Electronically Signed   By: Prudencio Pair M.D.   On: 01/06/2020 19:14        Scheduled Meds: . fluticasone furoate-vilanterol  1 puff Inhalation Daily  . nicotine  21 mg Transdermal Daily  . pantoprazole (PROTONIX) IV  40 mg Intravenous Q24H  . polyvinyl alcohol  1 drop Both Eyes Daily   Continuous Infusions: . lactated ringers 100 mL/hr at 01/08/20 1287     LOS: 2 days     Cordelia Poche, MD [facility] 01/08/2020, 2:34 PM  If 7PM-7AM, please contact night-coverage [email or web address]

## 2020-01-08 NOTE — Progress Notes (Signed)
NGT hooked to suction. Radiology notified gastrografin was administered at 1130. They will san in 8 hours. Patient tolerated clamping, no issues.

## 2020-01-09 MED ORDER — ATORVASTATIN CALCIUM 10 MG PO TABS
20.0000 mg | ORAL_TABLET | Freq: Every day | ORAL | Status: DC
  Administered 2020-01-09 – 2020-01-11 (×3): 20 mg via ORAL
  Filled 2020-01-09 (×5): qty 2

## 2020-01-09 MED ORDER — FLUOXETINE HCL 20 MG PO CAPS
20.0000 mg | ORAL_CAPSULE | Freq: Every day | ORAL | Status: DC
  Administered 2020-01-09 – 2020-01-11 (×3): 20 mg via ORAL
  Filled 2020-01-09 (×4): qty 1

## 2020-01-09 MED ORDER — ATENOLOL 25 MG PO TABS
25.0000 mg | ORAL_TABLET | Freq: Two times a day (BID) | ORAL | Status: DC
  Administered 2020-01-09 – 2020-01-12 (×7): 25 mg via ORAL
  Filled 2020-01-09 (×7): qty 1

## 2020-01-09 MED ORDER — BUPROPION HCL ER (XL) 150 MG PO TB24
300.0000 mg | ORAL_TABLET | Freq: Every day | ORAL | Status: DC
  Administered 2020-01-09 – 2020-01-11 (×3): 300 mg via ORAL
  Filled 2020-01-09 (×4): qty 2

## 2020-01-09 MED ORDER — LISINOPRIL 40 MG PO TABS
40.0000 mg | ORAL_TABLET | Freq: Every day | ORAL | Status: DC
  Administered 2020-01-09 – 2020-01-12 (×4): 40 mg via ORAL
  Filled 2020-01-09 (×4): qty 1

## 2020-01-09 MED ORDER — PREDNISONE 5 MG PO TABS
5.0000 mg | ORAL_TABLET | Freq: Every day | ORAL | Status: DC
  Administered 2020-01-09 – 2020-01-12 (×4): 5 mg via ORAL
  Filled 2020-01-09 (×4): qty 1

## 2020-01-09 NOTE — Progress Notes (Signed)
Subjective: Diarrhea overnight.  No nausea with NGT clamped, except 2 brief times, resolved.  ROS: See above, otherwise other systems negative  Objective: Vital signs in last 24 hours: Temp:  [97.9 F (36.6 C)-100.2 F (37.9 C)] 98.8 F (37.1 C) (09/18 0426) Pulse Rate:  [64-77] 64 (09/18 0426) Resp:  [16-18] 16 (09/18 0426) BP: (154-179)/(49-97) 179/50 (09/18 0426) SpO2:  [93 %-96 %] 93 % (09/18 0426) Last BM Date: 01/08/20  Intake/Output from previous day: 09/17 0701 - 09/18 0700 In: 746.9 [P.O.:60; I.V.:686.9] Out: -  Intake/Output this shift: No intake/output data recorded.  PE: Abd: soft, mildly tender in central abdomen, hernia is reduced, abdominal binder in place, +BS  Lab Results:  Recent Labs    01/07/20 0507  WBC 11.6*  HGB 12.7  HCT 39.6  PLT 343   BMET Recent Labs    01/07/20 0507  NA 140  K 3.9  CL 104  CO2 27  GLUCOSE 89  BUN 24*  CREATININE 0.75  CALCIUM 8.8*   PT/INR No results for input(s): LABPROT, INR in the last 72 hours. CMP     Component Value Date/Time   NA 140 01/07/2020 0507   K 3.9 01/07/2020 0507   CL 104 01/07/2020 0507   CO2 27 01/07/2020 0507   GLUCOSE 89 01/07/2020 0507   BUN 24 (H) 01/07/2020 0507   CREATININE 0.75 01/07/2020 0507   CALCIUM 8.8 (L) 01/07/2020 0507   PROT 7.4 01/06/2020 0005   ALBUMIN 3.9 01/06/2020 0005   AST 17 01/06/2020 0005   ALT 12 01/06/2020 0005   ALKPHOS 76 01/06/2020 0005   BILITOT 0.8 01/06/2020 0005   GFRNONAA >60 01/07/2020 0507   GFRAA >60 01/07/2020 0507   Lipase     Component Value Date/Time   LIPASE 23 01/06/2020 0005       Studies/Results: DG Abd Portable 1V-Small Bowel Obstruction Protocol-initial, 8 hr delay  Addendum Date: 01/08/2020   ADDENDUM REPORT: 01/08/2020 22:40 ADDENDUM: These results were called by telephone at the time of interpretation on 01/08/2020 at 8:45pm to provider Nurse Izora Gala on [facility] , who verbally acknowledged these results. Electronically  Signed   By: Iven Finn M.D.   On: 01/08/2020 22:40   Result Date: 01/08/2020 CLINICAL DATA:  Small-bowel obstruction 8 hours delay next EXAM: PORTABLE ABDOMEN - 1 VIEW COMPARISON:  X-ray abdomen 01/07/2020. FINDINGS: Enteric tube with tip overlying the expected region of the gastroesophageal junction and side port within the distal esophagus. Similar-appearing gaseous dilatation of several loops of small bowel measuring up to 4 cm in caliber. PO contrast noted to reach the rectum. Surgical clips and bowel anastomosis staples overlie the abdomen. No radio-opaque calculi or other significant radiographic abnormality are seen. Visualized lower thoraces demonstrate no focal consolidation or pleural effusion. IMPRESSION: 1. Findings suggestive of a partial small bowel obstruction or ileus with PO contrast reaching the rectum. 2. Of note, enteric tube with tip overlying the expected region of the gastroesophageal junction and side port within the distal esophagus. Recommend advancing by at least 10 cm. Electronically Signed: By: Iven Finn M.D. On: 01/08/2020 20:41    Anti-infectives: Anti-infectives (From admission, onward)   None       Assessment/Plan Hx ofCAD (prior stenting 2015)- previously followed at [facility] HTN HLD Chronic low back pain on chronic steroids Chronic bronchitis Tobacco abuse History of prior cholecystectomy,appendectomy, rightnephrectomy, partial colectomy and tubal ligation  SBO 2/2 large periumbilical hernia - Patients hernia issoft and  reducible this AM, less tender - SBO protocol this AM, has received prednisone and benadryl - diarrhea overnight, no residual output from NGT -DC NGT, adv to CLD -can follow up in office to discuss elective hernia repair  FEN -CLD VTE -SCDs,ok to have chemical prophylaxis from a surgical standpoint ID -None currently   LOS: 3 days    Henreitta Cea , [facility] 01/09/2020, 9:33 AM Please  see Amion for pager number during day hours 7:00am-4:30pm or 7:00am -11:30am on weekends

## 2020-01-09 NOTE — Progress Notes (Signed)
PROGRESS NOTE    Christine Stuart  YOV:[id number] DOB: 09/21/1945 DOA: 01/05/2020 PCP: Patient, No Pcp Per   Brief Narrative: Christine Stuart is a 74 y.o. female with medical history significant of HTN; anxiety-depression; kidney and colon CA; COPD on prn home O2; CAD/CVA; and tobacco dependence. Patient presented secondary to abdominal pain and found to have an SBO. NG tube placed and general surgery consulted for co-management.   Assessment & Plan:   Principal Problem:   SBO (small bowel obstruction) (HCC) Active Problems:   Hypertension   CVA (cerebral vascular accident) ([place])   COPD (chronic obstructive pulmonary disease) ([place])   Colon cancer (HCC)   Cancer of kidney, right (HCC)   CAD (coronary artery disease)   Anxiety and depression   Tobacco dependence   DNR (do not resuscitate)   Small bowel obstruction NG tube placed on admission. General surgery consulted. Complicated by abdominal hernia. Intermediate risk per cardiology -General surgery recommendations: NG tube removed, clear liquid diet  History of CVA/CAD -Continue Lipitor once NG tube discontinued  History of renal cell/colon cancer Prior history. Not currently on treatment.  Essential hypertension Uncontrolled -Continue IV hydralazine prn -Resume home atenolol, lisinopril  COPD On prednisone as an outpatient which was held on admission. -Resume prednisone  Anxiety Depression -Resume Prozac and bupropion -Continue Ativan   DVT prophylaxis: SCDs Code Status:   Code Status: DNR Family Communication: None at bedside Disposition Plan: Discharge likely in several days to home vs SNF pending continued general surgery recommendations for management of SBO   Consultants:   General surgery  Cardiology  Procedures:   None  Antimicrobials:  None    Subjective: Pain improved. Happy to have NG tube out. Eager to discharge.  Objective: Vitals:   01/08/20 0506 01/08/20 1442 01/08/20 2207  01/09/20 0426  BP: (!) 161/64 (!) 154/97 (!) 177/49 (!) 179/50  Pulse: 80 77 64 64  Resp: 17 18 18 16   Temp: 98.5 F (36.9 C) 100.2 F (37.9 C) 97.9 F (36.6 C) 98.8 F (37.1 C)  TempSrc: Oral Oral  Oral  SpO2: 93% 94% 96% 93%  Weight:      Height:        Intake/Output Summary (Last 24 hours) at 01/09/2020 1213 Last data filed at 01/08/2020 1528 Gross per 24 hour  Intake 746.88 ml  Output --  Net 746.88 ml   Filed Weights   01/05/20 2357  Weight: 62 kg    Examination:  General exam: Appears calm and comfortable Respiratory system: Clear to auscultation. Respiratory effort normal. Cardiovascular system: S1 & S2 heard, RRR. No murmurs, rubs, gallops or clicks. Femoral artery bruits heard bilaterally Gastrointestinal system: Abdomen is nondistended, soft and not significantly tender. Abdominal binder. No organomegaly or masses felt. Decreased bowel sounds heard. Central nervous system: Alert and oriented. No focal neurological deficits. Musculoskeletal: No edema. No calf tenderness Skin: No cyanosis. No rashes Psychiatry: Judgement and insight appear normal. Mood & affect appropriate.     Data Reviewed: I have personally reviewed following labs and imaging studies  CBC Lab Results  Component Value Date   WBC 11.6 (H) 01/07/2020   RBC 3.93 01/07/2020   HGB 12.7 01/07/2020   HCT 39.6 01/07/2020   MCV 100.8 (H) 01/07/2020   MCH 32.3 01/07/2020   PLT 343 01/07/2020   MCHC 32.1 01/07/2020   RDW 12.6 74/03/8785     Last metabolic panel Lab Results  Component Value Date   NA 140 01/07/2020   K 3.9  01/07/2020   CL 104 01/07/2020   CO2 27 01/07/2020   BUN 24 (H) 01/07/2020   CREATININE 0.75 01/07/2020   GLUCOSE 89 01/07/2020   GFRNONAA >60 01/07/2020   GFRAA >60 01/07/2020   CALCIUM 8.8 (L) 01/07/2020   PROT 7.4 01/06/2020   ALBUMIN 3.9 01/06/2020   BILITOT 0.8 01/06/2020   ALKPHOS 76 01/06/2020   AST 17 01/06/2020   ALT 12 01/06/2020   ANIONGAP 9  01/07/2020    CBG (last 3)  No results for input(s): GLUCAP in the last 72 hours.   GFR: Estimated Creatinine Clearance: 50.7 mL/min (by C-G formula based on SCr of 0.75 mg/dL).  Coagulation Profile: No results for input(s): INR, PROTIME in the last 168 hours.  Recent Results (from the past 240 hour(s))  SARS Coronavirus 2 by RT PCR (hospital order, performed in [facility] hospital lab) Nasopharyngeal Nasopharyngeal Swab     Status: None   Collection Time: 01/06/20  2:28 PM   Specimen: Nasopharyngeal Swab  Result Value Ref Range Status   SARS Coronavirus 2 NEGATIVE NEGATIVE Final    Comment: (NOTE) SARS-CoV-2 target nucleic acids are NOT DETECTED.  The SARS-CoV-2 RNA is generally detectable in upper and lower respiratory specimens during the acute phase of infection. The lowest concentration of SARS-CoV-2 viral copies this assay can detect is 250 copies / mL. A negative result does not preclude SARS-CoV-2 infection and should not be used as the sole basis for treatment or other patient management decisions.  A negative result may occur with improper specimen collection / handling, submission of specimen other than nasopharyngeal swab, presence of viral mutation(s) within the areas targeted by this assay, and inadequate number of viral copies (<250 copies / mL). A negative result must be combined with clinical observations, patient history, and epidemiological information.  Fact Sheet for Patients:   StrictlyIdeas.no  Fact Sheet for Healthcare Providers: BankingDealers.co.za  This test is not yet approved or  cleared by the [place] FDA and has been authorized for detection and/or diagnosis of SARS-CoV-2 by FDA under an Emergency Use Authorization (EUA).  This EUA will remain in effect (meaning this test can be used) for the duration of the COVID-19 declaration under Section 564(b)(1) of the Act, 21 U.S.C. section  360bbb-3(b)(1), unless the authorization is terminated or revoked sooner.  Performed at [facility], [place] [address] [place], [place] [address]         Radiology Studies: DG Abd Portable 1V-Small Bowel Obstruction Protocol-initial, 8 hr delay  Addendum Date: 01/08/2020   ADDENDUM REPORT: 01/08/2020 22:40 ADDENDUM: These results were called by telephone at the time of interpretation on 01/08/2020 at 8:45pm to provider Nurse Izora Gala on [facility] , who verbally acknowledged these results. Electronically Signed   By: Iven Finn M.D.   On: 01/08/2020 22:40   Result Date: 01/08/2020 CLINICAL DATA:  Small-bowel obstruction 8 hours delay next EXAM: PORTABLE ABDOMEN - 1 VIEW COMPARISON:  X-ray abdomen 01/07/2020. FINDINGS: Enteric tube with tip overlying the expected region of the gastroesophageal junction and side port within the distal esophagus. Similar-appearing gaseous dilatation of several loops of small bowel measuring up to 4 cm in caliber. PO contrast noted to reach the rectum. Surgical clips and bowel anastomosis staples overlie the abdomen. No radio-opaque calculi or other significant radiographic abnormality are seen. Visualized lower thoraces demonstrate no focal consolidation or pleural effusion. IMPRESSION: 1. Findings suggestive of a partial small bowel obstruction or ileus with PO contrast reaching the rectum. 2.  Of note, enteric tube with tip overlying the expected region of the gastroesophageal junction and side port within the distal esophagus. Recommend advancing by at least 10 cm. Electronically Signed: By: Iven Finn M.D. On: 01/08/2020 20:41        Scheduled Meds: . atenolol  25 mg Oral BID  . atorvastatin  20 mg Oral Daily  . buPROPion  300 mg Oral Daily  . FLUoxetine  20 mg Oral Daily  . fluticasone furoate-vilanterol  1 puff Inhalation Daily  . lisinopril  40 mg Oral Daily  . nicotine  21 mg Transdermal Daily  . pantoprazole (PROTONIX) IV  40 mg Intravenous  Q24H  . polyvinyl alcohol  1 drop Both Eyes Daily  . predniSONE  5 mg Oral Daily   Continuous Infusions: . lactated ringers 100 mL/hr at 01/09/20 0158     LOS: 3 days     Cordelia Poche, MD [facility] 01/09/2020, 12:13 PM  If 7PM-7AM, please contact night-coverage [email or web address]

## 2020-01-10 LAB — CBC
HCT: 38.1 % (ref 36.0–46.0)
Hemoglobin: 12.2 g/dL (ref 12.0–15.0)
MCH: 32.1 pg (ref 26.0–34.0)
MCHC: 32 g/dL (ref 30.0–36.0)
MCV: 100.3 fL — ABNORMAL HIGH (ref 80.0–100.0)
Platelets: 260 10*3/uL (ref 150–400)
RBC: 3.8 MIL/uL — ABNORMAL LOW (ref 3.87–5.11)
RDW: 12.4 % (ref 11.5–15.5)
WBC: 10.7 10*3/uL — ABNORMAL HIGH (ref 4.0–10.5)
nRBC: 0 % (ref 0.0–0.2)

## 2020-01-10 LAB — BASIC METABOLIC PANEL
Anion gap: 11 (ref 5–15)
BUN: 10 mg/dL (ref 8–23)
CO2: 23 mmol/L (ref 22–32)
Calcium: 7.8 mg/dL — ABNORMAL LOW (ref 8.9–10.3)
Chloride: 103 mmol/L (ref 98–111)
Creatinine, Ser: 0.59 mg/dL (ref 0.44–1.00)
GFR calc Af Amer: >60 mL/min (ref >60)
GFR calc non Af Amer: >60 mL/min (ref >60)
Glucose, Bld: 99 mg/dL (ref 70–99)
Potassium: 3.6 mmol/L (ref 3.5–5.1)
Sodium: 137 mmol/L (ref 135–145)

## 2020-01-10 MED ORDER — ONDANSETRON 4 MG PO TBDP: 8.0000 mg | ORAL_TABLET | Freq: Three times a day (TID) | ORAL | Status: DC | PRN

## 2020-01-10 MED ORDER — PANTOPRAZOLE SODIUM 40 MG PO TBEC
40.0000 mg | DELAYED_RELEASE_TABLET | Freq: Every day | ORAL | Status: DC
  Administered 2020-01-11: 40 mg via ORAL
  Filled 2020-01-10 (×2): qty 1

## 2020-01-10 MED ORDER — DIPHENHYDRAMINE HCL 25 MG PO CAPS: 25.0000 mg | ORAL_CAPSULE | Freq: Three times a day (TID) | ORAL | Status: DC | PRN

## 2020-01-10 NOTE — Progress Notes (Signed)
Subjective: Didn't like IV zofran, but likes ODT zofran from home.  Otherwise tolerated CLD from yesterday.  Still with some diarrhea.  ROS: See above, otherwise other systems negative  Objective: Vital signs in last 24 hours: Temp:  [98.5 F (36.9 C)-98.8 F (37.1 C)] 98.7 F (37.1 C) (09/19 0343) Pulse Rate:  [67-69] 68 (09/19 0343) Resp:  [16-18] 17 (09/19 0343) BP: (173-181)/(57-60) 175/59 (09/19 0343) SpO2:  [92 %-93 %] 93 % (09/19 0343) Last BM Date: 01/09/20  Intake/Output from previous day: 09/18 0701 - 09/19 0700 In: 1465.2 [P.O.:300; I.V.:1165.2] Out: -  Intake/Output this shift: No intake/output data recorded.  PE: Abd: soft, less tender, hernia reduced, +BS, ND  Lab Results:  No results for input(s): WBC, HGB, HCT, PLT in the last 72 hours. BMET No results for input(s): NA, K, CL, CO2, GLUCOSE, BUN, CREATININE, CALCIUM in the last 72 hours. PT/INR No results for input(s): LABPROT, INR in the last 72 hours. CMP     Component Value Date/Time   NA 140 01/07/2020 0507   K 3.9 01/07/2020 0507   CL 104 01/07/2020 0507   CO2 27 01/07/2020 0507   GLUCOSE 89 01/07/2020 0507   BUN 24 (H) 01/07/2020 0507   CREATININE 0.75 01/07/2020 0507   CALCIUM 8.8 (L) 01/07/2020 0507   PROT 7.4 01/06/2020 0005   ALBUMIN 3.9 01/06/2020 0005   AST 17 01/06/2020 0005   ALT 12 01/06/2020 0005   ALKPHOS 76 01/06/2020 0005   BILITOT 0.8 01/06/2020 0005   GFRNONAA >60 01/07/2020 0507   GFRAA >60 01/07/2020 0507   Lipase     Component Value Date/Time   LIPASE 23 01/06/2020 0005       Studies/Results: DG Abd Portable 1V-Small Bowel Obstruction Protocol-initial, 8 hr delay  Addendum Date: 01/08/2020   ADDENDUM REPORT: 01/08/2020 22:40 ADDENDUM: These results were called by telephone at the time of interpretation on 01/08/2020 at 8:45pm to provider Nurse Izora Gala on [facility] , who verbally acknowledged these results. Electronically Signed   By: Iven Finn M.D.   On:  01/08/2020 22:40   Result Date: 01/08/2020 CLINICAL DATA:  Small-bowel obstruction 8 hours delay next EXAM: PORTABLE ABDOMEN - 1 VIEW COMPARISON:  X-ray abdomen 01/07/2020. FINDINGS: Enteric tube with tip overlying the expected region of the gastroesophageal junction and side port within the distal esophagus. Similar-appearing gaseous dilatation of several loops of small bowel measuring up to 4 cm in caliber. PO contrast noted to reach the rectum. Surgical clips and bowel anastomosis staples overlie the abdomen. No radio-opaque calculi or other significant radiographic abnormality are seen. Visualized lower thoraces demonstrate no focal consolidation or pleural effusion. IMPRESSION: 1. Findings suggestive of a partial small bowel obstruction or ileus with PO contrast reaching the rectum. 2. Of note, enteric tube with tip overlying the expected region of the gastroesophageal junction and side port within the distal esophagus. Recommend advancing by at least 10 cm. Electronically Signed: By: Iven Finn M.D. On: 01/08/2020 20:41    Anti-infectives: Anti-infectives (From admission, onward)   None       Assessment/Plan Hx ofCAD (prior stenting 2015)- previously followed at [facility] HTN HLD Chronic low back pain on chronic steroids Chronic bronchitis Tobacco abuse History of prior cholecystectomy,appendectomy, rightnephrectomy, partial colectomy and tubal ligation  SBO 2/2 large periumbilical hernia - Patients hernia issoft and reducible this AM, lesstender -adv to soft diet and change zofran to ODT, which she takes at home and likes. -once she tolerates  some soft diet, she is surgically stable for DC home and can follow up in the office to discuss elective hernia repair  FEN -soft VTE -SCDs,ok to have chemical prophylaxis from a surgical standpoint ID -None currently   LOS: 4 days    Henreitta Cea , [facility] 01/10/2020, 8:45 AM Please see Amion for  pager number during day hours 7:00am-4:30pm or 7:00am -11:30am on weekends

## 2020-01-10 NOTE — Progress Notes (Signed)
PROGRESS NOTE    Christine Stuart  TFT:[id number] DOB: 1945/06/23 DOA: 01/05/2020 PCP: Patient, No Pcp Per   Brief Narrative: Christine Stuart is a 74 y.o. female with medical history significant of HTN; anxiety-depression; kidney and colon CA; COPD on prn home O2; CAD/CVA; and tobacco dependence. Patient presented secondary to abdominal pain and found to have an SBO. NG tube placed and general surgery consulted for co-management.   Assessment & Plan:   Principal Problem:   SBO (small bowel obstruction) (HCC) Active Problems:   Hypertension   CVA (cerebral vascular accident) ([place])   COPD (chronic obstructive pulmonary disease) ([place])   Colon cancer (HCC)   Cancer of kidney, right (HCC)   CAD (coronary artery disease)   Anxiety and depression   Tobacco dependence   DNR (do not resuscitate)   Small bowel obstruction NG tube placed on admission. General surgery consulted. Complicated by abdominal hernia. Intermediate risk per cardiology -General surgery recommendations: Soft diet -PT/OT eval  History of CVA/CAD -Continue Lipitor  History of renal cell/colon cancer Prior history. Not currently on treatment.  Essential hypertension Uncontrolled -Continue IV hydralazine prn -Continue home atenolol, lisinopril  COPD On prednisone as an outpatient which was held on admission. -Continue prednisone  Anxiety Depression -Continue Prozac and bupropion -Continue Ativan   DVT prophylaxis: SCDs Code Status:   Code Status: DNR Family Communication: Daughter at bedside Disposition Plan: Discharge likely in several days to home vs SNF pending continued general surgery recommendations for management of SBO   Consultants:   General surgery  Cardiology  Procedures:   None  Antimicrobials:  None    Subjective: Does not feel well today. Significant leg weakness when attempting to stand.   Objective: Vitals:   01/09/20 1506 01/09/20 2007 01/10/20 0343 01/10/20 0900   BP: (!) 173/60 (!) 181/57 (!) 175/59 (!) 182/160  Pulse: 67 69 68 67  Resp: 16 18 17    Temp: 98.5 F (36.9 C) 98.8 F (37.1 C) 98.7 F (37.1 C)   TempSrc: Oral Oral Oral   SpO2: 93% 92% 93%   Weight:      Height:        Intake/Output Summary (Last 24 hours) at 01/10/2020 1235 Last data filed at 01/10/2020 1000 Gross per 24 hour  Intake 1465.17 ml  Output 300 ml  Net 1165.17 ml   Filed Weights   01/05/20 2357  Weight: 62 kg    Examination:  General exam: Appears calm and comfortable Respiratory system: Clear to auscultation. Respiratory effort normal. Cardiovascular system: S1 & S2 heard, RRR. No murmurs, rubs, gallops or clicks. Gastrointestinal system: Abdomen is distended, soft and generally tender. Normal bowel sounds heard. Abdominal binder in place Central nervous system: Alert and oriented. No focal neurological deficits. Musculoskeletal: No edema. No calf tenderness Skin: No cyanosis. No rashes Psychiatry: Judgement and insight appear normal. Mood & affect appropriate.     Data Reviewed: I have personally reviewed following labs and imaging studies  CBC Lab Results  Component Value Date   WBC 11.6 (H) 01/07/2020   RBC 3.93 01/07/2020   HGB 12.7 01/07/2020   HCT 39.6 01/07/2020   MCV 100.8 (H) 01/07/2020   MCH 32.3 01/07/2020   PLT 343 01/07/2020   MCHC 32.1 01/07/2020   RDW 12.6 70/62/3762     Last metabolic panel Lab Results  Component Value Date   NA 140 01/07/2020   K 3.9 01/07/2020   CL 104 01/07/2020   CO2 27 01/07/2020   BUN 24 (  H) 01/07/2020   CREATININE 0.75 01/07/2020   GLUCOSE 89 01/07/2020   GFRNONAA >60 01/07/2020   GFRAA >60 01/07/2020   CALCIUM 8.8 (L) 01/07/2020   PROT 7.4 01/06/2020   ALBUMIN 3.9 01/06/2020   BILITOT 0.8 01/06/2020   ALKPHOS 76 01/06/2020   AST 17 01/06/2020   ALT 12 01/06/2020   ANIONGAP 9 01/07/2020    CBG (last 3)  No results for input(s): GLUCAP in the last 72 hours.   GFR: Estimated  Creatinine Clearance: 50.7 mL/min (by C-G formula based on SCr of 0.75 mg/dL).  Coagulation Profile: No results for input(s): INR, PROTIME in the last 168 hours.  Recent Results (from the past 240 hour(s))  SARS Coronavirus 2 by RT PCR (hospital order, performed in [facility] hospital lab) Nasopharyngeal Nasopharyngeal Swab     Status: None   Collection Time: 01/06/20  2:28 PM   Specimen: Nasopharyngeal Swab  Result Value Ref Range Status   SARS Coronavirus 2 NEGATIVE NEGATIVE Final    Comment: (NOTE) SARS-CoV-2 target nucleic acids are NOT DETECTED.  The SARS-CoV-2 RNA is generally detectable in upper and lower respiratory specimens during the acute phase of infection. The lowest concentration of SARS-CoV-2 viral copies this assay can detect is 250 copies / mL. A negative result does not preclude SARS-CoV-2 infection and should not be used as the sole basis for treatment or other patient management decisions.  A negative result may occur with improper specimen collection / handling, submission of specimen other than nasopharyngeal swab, presence of viral mutation(s) within the areas targeted by this assay, and inadequate number of viral copies (<250 copies / mL). A negative result must be combined with clinical observations, patient history, and epidemiological information.  Fact Sheet for Patients:   StrictlyIdeas.no  Fact Sheet for Healthcare Providers: BankingDealers.co.za  This test is not yet approved or  cleared by the [place] FDA and has been authorized for detection and/or diagnosis of SARS-CoV-2 by FDA under an Emergency Use Authorization (EUA).  This EUA will remain in effect (meaning this test can be used) for the duration of the COVID-19 declaration under Section 564(b)(1) of the Act, 21 U.S.C. section 360bbb-3(b)(1), unless the authorization is terminated or revoked sooner.  Performed at [place], [place] [address] [place], [place] [address]         Radiology Studies: DG Abd Portable 1V-Small Bowel Obstruction Protocol-initial, 8 hr delay  Addendum Date: 01/08/2020   ADDENDUM REPORT: 01/08/2020 22:40 ADDENDUM: These results were called by telephone at the time of interpretation on 01/08/2020 at 8:45pm to provider Nurse Izora Gala on [facility] , who verbally acknowledged these results. Electronically Signed   By: Iven Finn M.D.   On: 01/08/2020 22:40   Result Date: 01/08/2020 CLINICAL DATA:  Small-bowel obstruction 8 hours delay next EXAM: PORTABLE ABDOMEN - 1 VIEW COMPARISON:  X-ray abdomen 01/07/2020. FINDINGS: Enteric tube with tip overlying the expected region of the gastroesophageal junction and side port within the distal esophagus. Similar-appearing gaseous dilatation of several loops of small bowel measuring up to 4 cm in caliber. PO contrast noted to reach the rectum. Surgical clips and bowel anastomosis staples overlie the abdomen. No radio-opaque calculi or other significant radiographic abnormality are seen. Visualized lower thoraces demonstrate no focal consolidation or pleural effusion. IMPRESSION: 1. Findings suggestive of a partial small bowel obstruction or ileus with PO contrast reaching the rectum. 2. Of note, enteric tube with tip overlying the expected region of the gastroesophageal junction and  side port within the distal esophagus. Recommend advancing by at least 10 cm. Electronically Signed: By: Iven Finn M.D. On: 01/08/2020 20:41        Scheduled Meds: . atenolol  25 mg Oral BID  . atorvastatin  20 mg Oral Daily  . buPROPion  300 mg Oral Daily  . FLUoxetine  20 mg Oral Daily  . fluticasone furoate-vilanterol  1 puff Inhalation Daily  . lisinopril  40 mg Oral Daily  . nicotine  21 mg Transdermal Daily  . [START ON 01/11/2020] pantoprazole  40 mg Oral Daily  . polyvinyl alcohol  1 drop Both Eyes Daily  . predniSONE  5 mg Oral Daily   Continuous Infusions: .  lactated ringers 100 mL/hr at 01/10/20 0136     LOS: 4 days     Cordelia Poche, MD [facility] 01/10/2020, 12:35 PM  If 7PM-7AM, please contact night-coverage [email or web address]

## 2020-01-10 NOTE — Discharge Instructions (Signed)
Umbilical Hernia, Adult  A hernia is a bulge of tissue that pushes through an opening between muscles. An umbilical hernia happens in the abdomen, near the belly button (umbilicus). The hernia may contain tissues from the small intestine, large intestine, or fatty tissue covering the intestines (omentum). Umbilical hernias in adults tend to get worse over time, and they require surgical treatment. There are several types of umbilical hernias. You may have:  A hernia located just above or below the umbilicus (indirect hernia). This is the most common type of umbilical hernia in adults.  A hernia that forms through an opening formed by the umbilicus (direct hernia).  A hernia that comes and goes (reducible hernia). A reducible hernia may be visible only when you strain, lift something heavy, or cough. This type of hernia can be pushed back into the abdomen (reduced).  A hernia that traps abdominal tissue inside the hernia (incarcerated hernia). This type of hernia cannot be reduced.  A hernia that cuts off blood flow to the tissues inside the hernia (strangulated hernia). The tissues can start to die if this happens. This type of hernia requires emergency treatment. What are the causes? An umbilical hernia happens when tissue inside the abdomen presses on a weak area of the abdominal muscles. What increases the risk? You may have a greater risk of this condition if you:  Are obese.  Have had several pregnancies.  Have a buildup of fluid inside your abdomen (ascites).  Have had surgery that weakens the abdominal muscles. What are the signs or symptoms? The main symptom of this condition is a painless bulge at or near the belly button. A reducible hernia may be visible only when you strain, lift something heavy, or cough. Other symptoms may include:  Dull pain.  A feeling of pressure. Symptoms of a strangulated hernia may include:  Pain that gets increasingly worse.  Nausea and  vomiting.  Pain when pressing on the hernia.  Skin over the hernia becoming red or purple.  Constipation.  Blood in the stool. How is this diagnosed? This condition may be diagnosed based on:  A physical exam. You may be asked to cough or strain while standing. These actions increase the pressure inside your abdomen and force the hernia through the opening in your muscles. Your health care provider may try to reduce the hernia by pressing on it.  Your symptoms and medical history. How is this treated? Surgery is the only treatment for an umbilical hernia. Surgery for a strangulated hernia is done as soon as possible. If you have a small hernia that is not incarcerated, you may need to lose weight before having surgery. Follow these instructions at home:  Lose weight, if told by your health care provider.  Do not try to push the hernia back in.  Watch your hernia for any changes in color or size. Tell your health care provider if any changes occur.  You may need to avoid activities that increase pressure on your hernia.  Do not lift anything that is heavier than 10 lb (4.5 kg) until your health care provider says that this is safe.  Take over-the-counter and prescription medicines only as told by your health care provider.  Keep all follow-up visits as told by your health care provider. This is important. Contact a health care provider if:  Your hernia gets larger.  Your hernia becomes painful. Get help right away if:  You develop sudden, severe pain near the area of your hernia.    You have pain as well as nausea or vomiting.  You have pain and the skin over your hernia changes color.  You develop a fever. This information is not intended to replace advice given to you by your health care provider. Make sure you discuss any questions you have with your health care provider. Document Revised: 05/22/2017 Document Reviewed: 10/08/2016 Elsevier Patient Education  [place]. Bowel Obstruction A bowel obstruction means that something is blocking the small or large bowel. The bowel is also called the intestine. It is the long tube that connects the stomach to the opening of the butt (anus). When something blocks the bowel, food and fluids cannot pass through like normal. This condition needs to be treated. Treatment depends on the cause of the problem and how bad the problem is. What are the causes? Common causes of this condition include:  Scar tissue (adhesions) from past surgery or from high-energy X-rays (radiation).  Recent surgery in the belly. This affects how food moves in the bowel.  Some diseases, such as: ? Irritation of the lining of the digestive tract (Crohn's disease). ? Irritation of small pouches in the bowel (diverticulitis).  Growths or tumors.  A bulging organ (hernia).  Twisting of the bowel (volvulus).  A foreign body.  Slipping of a part of the bowel into another part (intussusception). What are the signs or symptoms? Symptoms of this condition include:  Pain in the belly.  Feeling sick to your stomach (nauseous).  Throwing up (vomiting).  Bloating in the belly.  Being unable to pass gas.  Trouble pooping (constipation).  Watery poop (diarrhea).  A lot of belching. How is this diagnosed? This condition may be diagnosed based on:  A physical exam.  Medical history.  Imaging tests, such as X-ray or CT scan.  Blood tests.  Urine tests. How is this treated? Treatment for this condition may include:  Fluids and pain medicines that are given through an IV tube. Your doctor may tell you not to eat or drink if you feel sick to your stomach and are throwing up.  Eating a clear liquid diet for a few days.  Putting a small tube (nasogastric tube) into the stomach. This will help with pain, discomfort, and nausea by removing blocked air and fluids from the stomach.  Surgery. This may be needed if other  treatments do not work. Follow these instructions at home: Medicines  Take over-the-counter and prescription medicines only as told by your doctor.  If you were prescribed an antibiotic medicine, take it as told by your doctor. Do not stop taking the antibiotic even if you start to feel better. General instructions  Follow your diet as told by your doctor. You may need to: ? Only drink clear liquids until you start to get better. ? Avoid solid foods.  Return to your normal activities as told by your doctor. Ask your doctor what activities are safe for you.  Do not sit for a long time without moving. Get up to take short walks every 1-2 hours. This is important. Ask for help if you feel weak or unsteady.  Keep all follow-up visits as told by your doctor. This is important. How is this prevented? After having a bowel obstruction, you may be more likely to have another. You can do some things to stop it from happening again.  If you have a long-term (chronic) disease, contact your doctor if you see changes or problems.  Take steps to prevent or  treat trouble pooping. Your doctor may ask that you: ? Drink enough fluid to keep your pee (urine) pale yellow. ? Take over-the-counter or prescription medicines. ? Eat foods that are high in fiber. These include beans, whole grains, and fresh fruits and vegetables. ? Limit foods that are high in fat and sugar. These include fried or sweet foods.  Stay active. Ask your doctor which exercises are safe for you.  Avoid stress.  Eat three small meals and three small snacks each day.  Work with a Publishing rights manager (dietitian) to make a meal plan that works for you.  Do not use any products that contain nicotine or tobacco, such as cigarettes and e-cigarettes. If you need help quitting, ask your doctor. Contact a doctor if:  You have a fever.  You have chills. Get help right away if:  You have pain or cramps that get worse.  You throw up  blood.  You are sick to your stomach.  You cannot stop throwing up.  You cannot drink fluids.  You feel mixed up (confused).  You feel very thirsty (dehydrated).  Your belly gets more bloated.  You feel weak or you pass out (faint). Summary  A bowel obstruction means that something is blocking the small or large bowel.  Treatment may include IV fluids and pain medicine. You may also have a clear liquid diet, a small tube in your stomach, or surgery.  Drink clear liquids and avoid solid foods until you get better. This information is not intended to replace advice given to you by your health care provider. Make sure you discuss any questions you have with your health care provider. Document Revised: 08/21/2017 Document Reviewed: 08/21/2017 Elsevier Patient Education  [place].

## 2020-01-11 NOTE — Progress Notes (Signed)
PT Cancellation Note  Patient Details Name: Christine Stuart MRN: [id number] DOB: 08/17/1945   Cancelled Treatment:    Reason Eval/Treat Not Completed: Patient declined, no reason specified. Reports she is tired. Would like to wait until later. Will return later today.    Terence Bart 01/11/2020, 9:39 AM

## 2020-01-11 NOTE — Progress Notes (Signed)
   Subjective/Chief Complaint: Tolerating po Denies abdominal pain   Objective: Vital signs in last 24 hours: Temp:  [98.6 F (37 C)-99.1 F (37.3 C)] 98.6 F (37 C) (09/20 0446) Pulse Rate:  [61-79] 79 (09/20 0446) Resp:  [16-18] 18 (09/20 0446) BP: (169-185)/(52-63) 169/60 (09/20 0601) SpO2:  [91 %-93 %] 93 % (09/20 0446) Last BM Date: 01/10/20  Intake/Output from previous day: 09/19 0701 - 09/20 0700 In: 2623.7 [P.O.:537; I.V.:2086.7] Out: 1400 [Urine:1400] Intake/Output this shift: No intake/output data recorded.  Exam: Awake and alert Abdomen soft, hernia easily reducible   Lab Results:  Recent Labs    01/10/20 1405  WBC 10.7*  HGB 12.2  HCT 38.1  PLT 260   BMET Recent Labs    01/10/20 1405  NA 137  K 3.6  CL 103  CO2 23  GLUCOSE 99  BUN 10  CREATININE 0.59  CALCIUM 7.8*   PT/INR No results for input(s): LABPROT, INR in the last 72 hours. ABG No results for input(s): PHART, HCO3 in the last 72 hours.  Invalid input(s): PCO2, PO2  Studies/Results: No results found.  Anti-infectives: Anti-infectives (From admission, onward)   None      Assessment/Plan: Hx ofCAD (prior stenting 2015)- previously followed at [facility] HTN HLD Chronic low back pain on chronic steroids Chronic bronchitis Tobacco abuse History of prior cholecystectomy,appendectomy, rightnephrectomy, partial colectomy and tubal ligation  SBO resolved from hernia  [place] for discharge from a surgical standpoint and follow-up with our office to plan elective hernia repair  LOS: 5 days    Coralie Keens 01/11/2020

## 2020-01-11 NOTE — Progress Notes (Signed)
OT Cancellation Note  Patient Details Name: Christine Stuart MRN: [id number] DOB: [birth year]   Cancelled Treatment:    Reason Eval/Treat Not Completed: Patient declined, no reason specified.  Continue efforts as appropriate.    Gerome Kokesh D Jaymon Dudek 01/11/2020, 10:34 AM  01/11/2020  Denice Paradise, OTR/L  Acute Rehabilitation Services  Office:  [phone]

## 2020-01-11 NOTE — Evaluation (Signed)
Physical Therapy Treatment Patient Details Name: Christine Stuart MRN: [id number] DOB: 02/11/1946 Today's Date: 01/11/2020    History of Present Illness Genelle Economou is a 74 y.o. female with medical history significant of HTN; anxiety-depression; kidney and colon CA; COPD on prn home O2; CAD/CVA; and tobacco dependence presenting with abdominal pain and n/v.  She has a hernia and it popped out and wouldn't go back in.  She started having pain and n/v.  She has vomited twice, started like the hiccups.  Pain is a bit better now.  Last normal BM was 2 days ago.    PT Comments    Patient is received sitting up on side of bed with daughter in law present. She is now agreeable to PT as she wants to go home. ( initially declined this am). Patient requires min guard for sit to stand. Ambulated with RW 30 feet in room with min guard. She is generally steady with RW. She wants her coffee. She will continue to benefit from skilled PT while here to improve strength and functional independence for safe return home with caregivers.      Follow Up Recommendations  No PT follow up     Equipment Recommendations  None recommended by PT    Recommendations for Other Services       Precautions / Restrictions Precautions Precautions: Fall Precaution Comments: mod fall Restrictions Weight Bearing Restrictions: No    Mobility  Bed Mobility               General bed mobility comments: Patient received sitting up on side of bed, Returned to sitting on side of bed.  Transfers Overall transfer level: Needs assistance Equipment used: Rolling walker (2 wheeled) Transfers: Sit to/from Stand Sit to Stand: Min guard            Ambulation/Gait Ambulation/Gait assistance: Min guard Gait Distance (Feet): 30 Feet Assistive device: Rolling walker (2 wheeled) Gait Pattern/deviations: Step-through pattern;Decreased stride length;Shuffle Gait velocity: decreased   General Gait Details: patient  ambulates with slow cadence, min guard. No lob.   Stairs             Wheelchair Mobility    Modified Rankin (Stroke Patients Only)       Balance Overall balance assessment: Modified Independent                                          Cognition Arousal/Alertness: Awake/alert Behavior During Therapy: WFL for tasks assessed/performed Overall Cognitive Status: Within Functional Limits for tasks assessed                                        Exercises      General Comments        Pertinent Vitals/Pain Pain Assessment: No/denies pain    Home Living Family/patient expects to be discharged to:: Private residence Living Arrangements: Other (Comment);Children Available Help at Discharge: Family;Available PRN/intermittently;Personal care attendant Type of Home: House Home Access: Stairs to enter   Home Layout: One level Home Equipment: Walker - 2 wheels Additional Comments: has walker, but was not using prior to admission. Lives alone but has PCA 10 hours daily M-F. Daughter in law attentive and can assist as well. She usually checks in on her on the weekends    Prior Function  Level of Independence: Independent          PT Goals (current goals can now be found in the care plan section) Acute Rehab PT Goals Patient Stated Goal: to return home PT Goal Formulation: With patient/family Time For Goal Achievement: 01/18/20 Potential to Achieve Goals: Good    Frequency    Min 3X/week      PT Plan      Co-evaluation              AM-PAC PT "6 Clicks" Mobility   Outcome Measure  Help needed turning from your back to your side while in a flat bed without using bedrails?: A Little Help needed moving from lying on your back to sitting on the side of a flat bed without using bedrails?: A Little Help needed moving to and from a bed to a chair (including a wheelchair)?: A Little Help needed standing up from a chair using  your arms (e.g., wheelchair or bedside chair)?: A Little Help needed to walk in hospital room?: A Little Help needed climbing 3-5 steps with a railing? : A Little 6 Click Score: 18    End of Session Equipment Utilized During Treatment: Gait belt Activity Tolerance: Patient tolerated treatment well Patient left: in bed;with call bell/phone within reach;with family/visitor present Nurse Communication: Mobility status PT Visit Diagnosis: Muscle weakness (generalized) (M62.81);Difficulty in walking, not elsewhere classified (R26.2)     Time: 1005-1025 PT Time Calculation (min) (ACUTE ONLY): 20 min  Charges:  $Gait Training: 8-22 mins                     Victorina Kable, PT, GCS 01/11/20,11:08 AM

## 2020-01-11 NOTE — Progress Notes (Signed)
PROGRESS NOTE    Christine Stuart  QBV:[id number] DOB: 04-15-1946 DOA: 01/05/2020 PCP: Patient, No Pcp Per   Brief Narrative: Christine Stuart is a 74 y.o. female with medical history significant of HTN; anxiety-depression; kidney and colon CA; COPD on prn home O2; CAD/CVA; and tobacco dependence. Patient presented secondary to abdominal pain and found to have an SBO. NG tube placed and general surgery consulted for co-management.   Assessment & Plan:   Principal Problem:   SBO (small bowel obstruction) (HCC) Active Problems:   Hypertension   CVA (cerebral vascular accident) ([place])   COPD (chronic obstructive pulmonary disease) ([place])   Colon cancer (HCC)   Cancer of kidney, right (HCC)   CAD (coronary artery disease)   Anxiety and depression   Tobacco dependence   DNR (do not resuscitate)   Small bowel obstruction NG tube placed on admission. General surgery consulted. Complicated by abdominal hernia. Intermediate risk per cardiology. PT recommending no follow-up. -General surgery recommendations: Soft diet  History of CVA/CAD -Continue Lipitor  History of renal cell/colon cancer Prior history. Not currently on treatment.  Essential hypertension Uncontrolled -Continue IV hydralazine prn -Continue home atenolol, lisinopril  COPD On prednisone as an outpatient which was held on admission. -Continue prednisone  Anxiety Depression Patient appears to be depressed. Sad about her deceased son. -Continue Prozac and bupropion -Continue Ativan  Hallucinations Unsure if this is medication related or possibly delirium -Delirium precautions -Observe   DVT prophylaxis: SCDs Code Status:   Code Status: DNR Family Communication: Daughter at bedside Disposition Plan: Discharge home likely in 24 hours pending improvement of oral intake   Consultants:   General surgery  Cardiology  Procedures:   None  Antimicrobials:  None    Subjective: Sad today. Feels she has  been having hallucinations.  Objective: Vitals:   01/10/20 2009 01/11/20 0446 01/11/20 0601 01/11/20 1040  BP: (!) 176/61 (!) 185/63 (!) 169/60 (!) 158/51  Pulse: 72 79  70  Resp: 17 18    Temp: 99.1 F (37.3 C) 98.6 F (37 C)    TempSrc: Oral Oral    SpO2: 91% 93%    Weight:      Height:        Intake/Output Summary (Last 24 hours) at 01/11/2020 1237 Last data filed at 01/11/2020 0641 Gross per 24 hour  Intake 2623.72 ml  Output 1100 ml  Net 1523.72 ml   Filed Weights   01/05/20 2357  Weight: 62 kg    Examination:  General exam: Appears calm and comfortable Respiratory system: Clear to auscultation. Respiratory effort normal. Cardiovascular system: S1 & S2 heard, RRR. No murmurs, rubs, gallops or clicks. Gastrointestinal system: Abdomen is distended, soft and tender. No organomegaly or masses felt. Normal bowel sounds heard. Central nervous system: Alert and oriented. No focal neurological deficits. Musculoskeletal: No edema. No calf tenderness Skin: No cyanosis. No rashes Psychiatry: Judgement and insight appear normal. Depressed mood. Flat affect.    Data Reviewed: I have personally reviewed following labs and imaging studies  CBC Lab Results  Component Value Date   WBC 10.7 (H) 01/10/2020   RBC 3.80 (L) 01/10/2020   HGB 12.2 01/10/2020   HCT 38.1 01/10/2020   MCV 100.3 (H) 01/10/2020   MCH 32.1 01/10/2020   PLT 260 01/10/2020   MCHC 32.0 01/10/2020   RDW 12.4 28/00/3491     Last metabolic panel Lab Results  Component Value Date   NA 137 01/10/2020   K 3.6 01/10/2020   CL  103 01/10/2020   CO2 23 01/10/2020   BUN 10 01/10/2020   CREATININE 0.59 01/10/2020   GLUCOSE 99 01/10/2020   GFRNONAA >60 01/10/2020   GFRAA >60 01/10/2020   CALCIUM 7.8 (L) 01/10/2020   PROT 7.4 01/06/2020   ALBUMIN 3.9 01/06/2020   BILITOT 0.8 01/06/2020   ALKPHOS 76 01/06/2020   AST 17 01/06/2020   ALT 12 01/06/2020   ANIONGAP 11 01/10/2020    CBG (last 3)  No  results for input(s): GLUCAP in the last 72 hours.   GFR: Estimated Creatinine Clearance: 50.7 mL/min (by C-G formula based on SCr of 0.59 mg/dL).  Coagulation Profile: No results for input(s): INR, PROTIME in the last 168 hours.  Recent Results (from the past 240 hour(s))  SARS Coronavirus 2 by RT PCR (hospital order, performed in [facility] hospital lab) Nasopharyngeal Nasopharyngeal Swab     Status: None   Collection Time: 01/06/20  2:28 PM   Specimen: Nasopharyngeal Swab  Result Value Ref Range Status   SARS Coronavirus 2 NEGATIVE NEGATIVE Final    Comment: (NOTE) SARS-CoV-2 target nucleic acids are NOT DETECTED.  The SARS-CoV-2 RNA is generally detectable in upper and lower respiratory specimens during the acute phase of infection. The lowest concentration of SARS-CoV-2 viral copies this assay can detect is 250 copies / mL. A negative result does not preclude SARS-CoV-2 infection and should not be used as the sole basis for treatment or other patient management decisions.  A negative result may occur with improper specimen collection / handling, submission of specimen other than nasopharyngeal swab, presence of viral mutation(s) within the areas targeted by this assay, and inadequate number of viral copies (<250 copies / mL). A negative result must be combined with clinical observations, patient history, and epidemiological information.  Fact Sheet for Patients:   StrictlyIdeas.no  Fact Sheet for Healthcare Providers: BankingDealers.co.za  This test is not yet approved or  cleared by the [place] FDA and has been authorized for detection and/or diagnosis of SARS-CoV-2 by FDA under an Emergency Use Authorization (EUA).  This EUA will remain in effect (meaning this test can be used) for the duration of the COVID-19 declaration under Section 564(b)(1) of the Act, 21 U.S.C. section 360bbb-3(b)(1), unless the authorization is  terminated or revoked sooner.  Performed at [facility], [place] [address], [place], [place] [address]         Radiology Studies: No results found.      Scheduled Meds: . atenolol  25 mg Oral BID  . atorvastatin  20 mg Oral Daily  . buPROPion  300 mg Oral Daily  . FLUoxetine  20 mg Oral Daily  . lisinopril  40 mg Oral Daily  . nicotine  21 mg Transdermal Daily  . pantoprazole  40 mg Oral Daily  . polyvinyl alcohol  1 drop Both Eyes Daily  . predniSONE  5 mg Oral Daily   Continuous Infusions:    LOS: 5 days     Cordelia Poche, MD [facility] 01/11/2020, 12:37 PM  If 7PM-7AM, please contact night-coverage [email or web address]

## 2020-01-11 NOTE — Evaluation (Signed)
Occupational Therapy Evaluation Patient Details Name: Christine Stuart MRN: [id number] DOB: 05-Oct-1945 Today's Date: 01/11/2020    History of Present Illness Christine Stuart is a 74 y.o. female with medical history significant of HTN; anxiety-depression; kidney and colon CA; COPD on prn home O2; CAD/CVA; and tobacco dependence presenting with abdominal pain and n/v.  She has a hernia and it popped out and wouldn't go back in.  She started having pain and n/v.  She has vomited twice, started like the hiccups.  Pain is a bit better now.  Last normal BM was 2 days ago.   Clinical Impression   Patient was admitted with the above diagnosis.  She presents with mild impairments to LB ADL and functional mobility.  She plans for d/c home today per the daughter-in-law.  She does not wish to pursue [facility] OT; she has a PCA for 10 hours each day M-F, and her daughter-in-law checks in on her over the weekend.  The patient requests a shower chair and 3n1 BSC for home use.  The patient states that she may need a little more help form her PCA, but is ol with that over the short term.  No further OT needs in the acute setting.  Patient has a safe plan for discharge home with assist as needed.  Encouraged mobility with PT and staff.      Follow Up Recommendations  No OT follow up;Supervision/Assistance - 24 hour    Equipment Recommendations  3 in 1 bedside commode;Tub/shower seat    Recommendations for Other Services       Precautions / Restrictions Precautions Precautions: Fall Precaution Comments: mod fall Restrictions Weight Bearing Restrictions: No      Mobility                General bed mobility comments: Patient received sitting up on side of bed, Returned to sitting on side of bed.  Transfers Overall transfer level: Needs assistance Equipment used: Rolling walker (2 wheeled) Transfers: Sit to/from Stand Sit to Stand: Min guard              Balance Overall balance assessment: No  apparent balance deficits (not formally assessed)                                         ADL either performed or assessed with clinical judgement   ADL Overall ADL's : Needs assistance/impaired Eating/Feeding: Independent;Sitting   Grooming: Wash/dry hands;Wash/dry face;Set up;Sitting   Upper Body Bathing: Set up;Sitting   Lower Body Bathing: Minimal assistance;Sit to/from stand   Upper Body Dressing : Set up;Sitting   Lower Body Dressing: Minimal assistance;Sit to/from stand Lower Body Dressing Details (indicate cue type and reason): not comfortable bending to reach B feet at EOB. Toilet Transfer: RW;Min Administrator, arts and Hygiene: Supervision/safety;Sit to/from stand       Functional mobility during ADLs: Rolling walker;Min guard       Vision Patient Visual Report: No change from baseline Vision Assessment?: No apparent visual deficits     Perception     Praxis      Pertinent Vitals/Pain Pain Assessment: No/denies pain     Hand Dominance Right   Extremity/Trunk Assessment Upper Extremity Assessment Upper Extremity Assessment: Overall WFL for tasks assessed   Lower Extremity Assessment Lower Extremity Assessment: Overall WFL for tasks assessed   Cervical / Trunk Assessment Cervical /  Trunk Assessment: Normal   Communication Communication Communication: No difficulties   Cognition Arousal/Alertness: Awake/alert Behavior During Therapy: WFL for tasks assessed/performed;Flat affect Overall Cognitive Status: Within Functional Limits for tasks assessed                                                      Home Living Family/patient expects to be discharged to:: Private residence Living Arrangements: Children;Other (Comment) (PCA) Available Help at Discharge: Family;Personal care attendant Type of Home: House Home Access: Stairs to enter [employer] of Steps: 1   Home Layout:  One level     Bathroom Shower/Tub: Teacher, early years/pre: Standard     Home Equipment: Environmental consultant - 2 wheels   Additional Comments: has walker, but was not using prior to admission. Lives alone but has PCA 10 hours daily M-F. Daughter in law attentive and can assist as well. She usually checks in on her on the weekends      Prior Functioning/Environment Level of Independence: Independent                 OT Problem List: Impaired balance (sitting and/or standing);Other (comment) (declines to LB ADL)      OT Treatment/Interventions:      OT Goals(Current goals can be found in the care plan section) Acute Rehab OT Goals Patient Stated Goal: Just get back home so I can do more. OT Goal Formulation: With patient Time For Goal Achievement: 01/18/20 Potential to Achieve Goals: Good ADL Goals Pt Will Perform Lower Body Bathing: with set-up;sit to/from stand Pt Will Perform Lower Body Dressing: with set-up;sit to/from stand Pt Will Transfer to Toilet: with modified independence;ambulating Pt Will Perform Tub/Shower Transfer: with modified independence;shower seat  OT Frequency:     Barriers to D/C:  None Noted          Co-evaluation              AM-PAC OT "6 Clicks" Daily Activity     Outcome Measure Help from another person eating meals?: None Help from another person taking care of personal grooming?: None Help from another person toileting, which includes using toliet, bedpan, or urinal?: A Little Help from another person bathing (including washing, rinsing, drying)?: A Little Help from another person to put on and taking off regular upper body clothing?: None Help from another person to put on and taking off regular lower body clothing?: A Little 6 Click Score: 21   End of Session Equipment Utilized During Treatment: Rolling walker Nurse Communication: Other (comment) (patient's position)  Activity Tolerance: Patient tolerated treatment well;No  increased pain Patient left: in bed;with call bell/phone within reach  OT Visit Diagnosis: Unsteadiness on feet (R26.81)                Time: 0233-4356 OT Time Calculation (min): 19 min Charges:  OT General Charges $OT Visit: 1 Visit OT Evaluation $OT Eval Moderate Complexity: 1 Mod  01/11/2020  Christine Stuart, OTR/L  Acute Rehabilitation Services  Office:  [phone]  Metta Clines 01/11/2020, 1:59 PM

## 2020-01-11 NOTE — Progress Notes (Signed)
Patient states she can't take breo inhaler. She states she needs dulera. Will try to get MD to order.

## 2020-01-12 NOTE — Progress Notes (Signed)
   Subjective/Chief Complaint: States she had a busy night with a lot of visitors. Per RN was hallucinating overnight. Oriented to person, place, time during my exam.  Tolerating po Denies abdominal pain but states her hernia is sore when you push on it.   Objective: Vital signs in last 24 hours: Temp:  [98.8 F (37.1 C)-99.5 F (37.5 C)] 99.5 F (37.5 C) (09/21 0545) Pulse Rate:  [63-74] 69 (09/21 0754) Resp:  [17-18] 17 (09/21 0545) BP: (158-188)/(51-56) 169/56 (09/21 0754) SpO2:  [93 %-94 %] 94 % (09/21 0545) Last BM Date: 01/10/20  Intake/Output from previous day: 09/20 0701 - 09/21 0700 In: 120 [P.O.:120] Out: 0  Intake/Output this shift: No intake/output data recorded.  Exam: Awake and alert Abdomen soft, hernia easily reducible   Lab Results:  Recent Labs    01/10/20 1405  WBC 10.7*  HGB 12.2  HCT 38.1  PLT 260   BMET Recent Labs    01/10/20 1405  NA 137  K 3.6  CL 103  CO2 23  GLUCOSE 99  BUN 10  CREATININE 0.59  CALCIUM 7.8*   PT/INR No results for input(s): LABPROT, INR in the last 72 hours. ABG No results for input(s): PHART, HCO3 in the last 72 hours.  Invalid input(s): PCO2, PO2  Studies/Results: No results found.  Anti-infectives: Anti-infectives (From admission, onward)   None      Assessment/Plan: Hx ofCAD (prior stenting 2015)- previously followed at [facility] HTN HLD Chronic low back pain on chronic steroids Chronic bronchitis Tobacco abuse History of prior cholecystectomy,appendectomy, rightnephrectomy, partial colectomy and tubal ligation  SBO resolved from hernia Ignacio for discharge from a surgical standpoint and follow-up with our office to plan elective hernia repair  LOS: 6 days    Jill Alexanders 01/12/2020

## 2020-01-12 NOTE — Progress Notes (Signed)
Physical Therapy Treatment Patient Details Name: Christine Stuart MRN: [id number] DOB: 29-Mar-1946 Today's Date: 01/12/2020    History of Present Illness Christine Stuart is a 74 y.o. female with medical history significant of HTN; anxiety-depression; kidney and colon CA; COPD on prn home O2; CAD/CVA; and tobacco dependence presenting with abdominal pain and n/v.  She has a hernia and it popped out and wouldn't go back in.  She started having pain and n/v.  She has vomited twice, started like the hiccups.  Pain is a bit better now.  Last normal BM was 2 days ago.    PT Comments    Patient received in bed, reluctant to participate. With encouragement she agrees. Daughter in law present and involved in care. She requires min assist with bed mobility due to weakness. Min assist to raise trunk and to lift LEs back into bed. She requires min assist with initial standing due to posterior lean. Ambulated 45 feet with RW and min guard. 1 episode of lob requiring min assist to regain. She will continue to benefit from skilled PT while here to improve strength and functional independence.      Follow Up Recommendations  No PT follow up- doesn't feel like she will nee it     Equipment Recommendations  None recommended by PT    Recommendations for Other Services       Precautions / Restrictions Precautions Precautions: Fall Precaution Comments: mod fall Restrictions Weight Bearing Restrictions: No    Mobility  Bed Mobility Overal bed mobility: Needs Assistance Bed Mobility: Supine to Sit;Sit to Supine     Supine to sit: Min assist Sit to supine: Min assist   General bed mobility comments: Patient requires min assist for bed mobility due to weakness  Transfers Overall transfer level: Needs assistance Equipment used: Rolling walker (2 wheeled) Transfers: Sit to/from Stand Sit to Stand: Min assist         General transfer comment: for initial standing  balance  Ambulation/Gait Ambulation/Gait assistance: Min guard;Min assist Gait Distance (Feet): 45 Feet Assistive device: Rolling walker (2 wheeled) Gait Pattern/deviations: Step-through pattern;Shuffle;Decreased step length - right;Decreased step length - left;Decreased stride length Gait velocity: decreased   General Gait Details: one lob with ambulation this date. Min assist to regain. She reports dizziness with ambulation. Weak.   Stairs             Wheelchair Mobility    Modified Rankin (Stroke Patients Only)       Balance Overall balance assessment: Needs assistance Sitting-balance support: Feet supported Sitting balance-Leahy Scale: Good     Standing balance support: Bilateral upper extremity supported;During functional activity Standing balance-Leahy Scale: Poor Standing balance comment: requires RW and min guard to min assist for safety                            Cognition Arousal/Alertness: Awake/alert Behavior During Therapy: WFL for tasks assessed/performed;Flat affect Overall Cognitive Status: Within Functional Limits for tasks assessed                                        Exercises      General Comments        Pertinent Vitals/Pain Pain Assessment: Faces Faces Pain Scale: Hurts a little bit Pain Location: headache Pain Descriptors / Indicators: Aching Pain Intervention(s): Patient requesting pain meds-RN notified  Home Living                      Prior Function            PT Goals (current goals can now be found in the care plan section) Acute Rehab PT Goals Patient Stated Goal: Just get back home so I can do more. PT Goal Formulation: With patient Time For Goal Achievement: 01/18/20 Potential to Achieve Goals: Good Progress towards PT goals: Progressing toward goals    Frequency    Min 3X/week      PT Plan Current plan remains appropriate    Co-evaluation               AM-PAC PT "6 Clicks" Mobility   Outcome Measure  Help needed turning from your back to your side while in a flat bed without using bedrails?: A Little Help needed moving from lying on your back to sitting on the side of a flat bed without using bedrails?: A Little Help needed moving to and from a bed to a chair (including a wheelchair)?: A Little Help needed standing up from a chair using your arms (e.g., wheelchair or bedside chair)?: A Little Help needed to walk in hospital room?: A Little Help needed climbing 3-5 steps with a railing? : A Little 6 Click Score: 18    End of Session Equipment Utilized During Treatment: Gait belt Activity Tolerance: Patient tolerated treatment well;Patient limited by fatigue Patient left: in bed;with call bell/phone within reach;with bed alarm set;with family/visitor present Nurse Communication: Mobility status;Patient requests pain meds PT Visit Diagnosis: Muscle weakness (generalized) (M62.81);Difficulty in walking, not elsewhere classified (R26.2)     Time: 1335-1400 PT Time Calculation (min) (ACUTE ONLY): 25 min  Charges:  $Gait Training: 23-37 mins                     Mikhaila Roh, PT, GCS 01/12/20,2:10 PM

## 2020-01-12 NOTE — Progress Notes (Signed)
Christine Stuart to be D/C'd per MD order. Discussed with the patient and all questions fully answered.  VSS, Skin clean, dry, and intact without evidence of skin break down.  IV catheter discontinued intact. Site without signs and symptoms of complications. Dressing and pressure applied.  An After Visit Summary was printed and given to the patient.   D/c education completed with patient and family including follow up instructions, medication list, d/c activities limitations if indicated, with other d/c instructions as indicated by MD - patient able to verbalize understanding, all questions fully answered.  Patient instructed to return to ED, call 911, or call MD for any changes in condition.  Patient to be escorted via [place], and D/C home via private auto.

## 2020-01-12 NOTE — Discharge Summary (Signed)
Physician Discharge Summary  Christine Stuart EXB:[id number] DOB: 28-Mar-1946 DOA: 01/05/2020  PCP: Patient, No Pcp Per  Admit date: 01/05/2020 Discharge date: 01/12/2020  Admitted From: Home Disposition: Home  Recommendations for Outpatient Follow-up:  1. Follow up with PCP in 1 week 2. Follow up with general surgery 3. Please follow up on the following pending results: None  Home Health: None Equipment/Devices: None  Discharge Condition: Stable CODE STATUS: DNR Diet recommendation: Soft diet   Brief/Interim Summary:  Admission HPI written by Karmen Bongo, MD   Chief Complaint: abdominal pain  HPI: Christine Stuart is a 74 y.o. female with medical history significant of HTN; anxiety-depression; kidney and colon CA; COPD on prn home O2; CAD/CVA; and tobacco dependence presenting with abdominal pain and n/v.  She has a hernia and it popped out and wouldn't go back in.  She started having pain and n/v.  She has vomited twice, started like the hiccups.  Pain is a bit better now.  Last normal BM was 2 days ago.    Her youngest son was shot 2 decades ago; she was his caregiver and he died.  Her older son died in 28-Jun-2017.  Her husband died in 27-Dec-2018.   Hospital course:  Small bowel obstruction NG tube placed on admission. General surgery consulted. Complicated by abdominal hernia. Intermediate risk per cardiology. PT recommending no follow-up. Soft diet. Outpatient follow-up with general surgery.  Umbilical hernia Outpatient general surgery follow-up.  History of CVA/CAD Continue Lipitor  History of renal cell/colon cancer Prior history. Not currently on treatment.  Essential hypertension Uncontrolled. Continue home atenolol, lisinopril  COPD On prednisone as an outpatient which was held on admission. Continue prednisone.  Anxiety Depression Patient appears to be depressed. Sad about her deceased son. Continue Prozac and  bupropion  Hallucinations Unsure if this is medication related or possibly delirium. Recommend outpatient observation and if no improvement, PCP follow-up.  Discharge Diagnoses:  Principal Problem:   SBO (small bowel obstruction) (HCC) Active Problems:   Hypertension   CVA (cerebral vascular accident) ([place])   COPD (chronic obstructive pulmonary disease) (HCC)   Colon cancer (HCC)   Cancer of kidney, right (HCC)   CAD (coronary artery disease)   Anxiety and depression   Tobacco dependence   DNR (do not resuscitate)    Discharge Instructions  Discharge Instructions    Call MD for:  severe uncontrolled pain   Complete by: As directed    Increase activity slowly   Complete by: As directed      Allergies as of 01/12/2020      Reactions   Codeine Swelling   Erythromycin Hives, Itching, Shortness Of Breath, Swelling   Iodinated Diagnostic Agents Hives, Itching, Shortness Of Breath, Swelling   Other Swelling, Other (See Comments)   Propoxyphene N-acetaminophen   Penicillins Hives, Itching, Shortness Of Breath, Swelling   Sulfa Antibiotics Hives, Shortness Of Breath   Influenza Vaccines Nausea And Vomiting, Other (See Comments)   Fever; Pt stated she was sick for days   Naproxen Swelling, Other (See Comments)   Tongue and throat   Oxycodone-acetaminophen Swelling   Pneumococcal Vaccine Nausea And Vomiting      Medication List    TAKE these medications   albuterol 108 (90 Base) MCG/ACT inhaler Commonly known as: VENTOLIN HFA Inhale 2 puffs into the lungs every 6 (six) hours as needed for wheezing.   ALPRAZolam 0.25 MG tablet Commonly known as: XANAX Take 0.25 mg by mouth daily as needed  for anxiety.   aspirin 81 MG EC tablet Take 81 mg by mouth daily.   atenolol 25 MG tablet Commonly known as: TENORMIN Take 25 mg by mouth 2 (two) times daily.   atorvastatin 20 MG tablet Commonly known as: LIPITOR Take 20 mg by mouth daily.   Breo Ellipta 100-25 MCG/INH  Aepb Generic drug: fluticasone furoate-vilanterol Inhale 1 puff into the lungs daily.   buPROPion 300 MG 24 hr tablet Commonly known as: WELLBUTRIN XL Take 300 mg by mouth daily.   ciprofloxacin 250 MG tablet Commonly known as: CIPRO Take 250 mg by mouth 2 (two) times daily.   esomeprazole 40 MG capsule Commonly known as: NEXIUM Take 40 mg by mouth daily.   FLUoxetine 20 MG capsule Commonly known as: PROZAC Take 20 mg by mouth daily.   ipratropium-albuterol 0.5-2.5 (3) MG/3ML Soln Commonly known as: DUONEB Take 3 mLs by nebulization as needed for wheezing or shortness of breath.   lisinopril 40 MG tablet Commonly known as: ZESTRIL Take 40 mg by mouth daily.   nitroGLYCERIN 0.4 MG SL tablet Commonly known as: NITROSTAT Place 0.4 mg under the tongue every 5 (five) minutes as needed for chest pain.   ondansetron 8 MG disintegrating tablet Commonly known as: ZOFRAN-ODT Take 8 mg by mouth 3 (three) times daily as needed for nausea.   OXYGEN Inhale 2 L into the lungs as needed for shortness of breath.   predniSONE 5 MG tablet Commonly known as: DELTASONE Take 5 mg by mouth daily.   Systane Balance 0.6 % Soln Generic drug: Propylene Glycol Place 1 drop into both eyes daily.   triamcinolone cream 0.1 % Commonly known as: KENALOG Apply 1 application topically daily.       Follow-up Information    [facility] [place] Follow up in 3 week(s).   Specialty: General Surgery Why: Call to schedule an appointment with a surgeon for a hernia repair Contact information: [address] [place] [address] [phone]              Allergies  Allergen Reactions  . Codeine Swelling  . Erythromycin Hives, Itching, Shortness Of Breath and Swelling  . Iodinated Diagnostic Agents Hives, Itching, Shortness Of Breath and Swelling  . Other Swelling and Other (See Comments)    Propoxyphene N-acetaminophen  . Penicillins Hives, Itching, Shortness Of Breath  and Swelling  . Sulfa Antibiotics Hives and Shortness Of Breath  . Influenza Vaccines Nausea And Vomiting and Other (See Comments)    Fever; Pt stated she was sick for days  . Naproxen Swelling and Other (See Comments)    Tongue and throat  . Oxycodone-Acetaminophen Swelling  . Pneumococcal Vaccine Nausea And Vomiting    Consultations:  General surgery   Procedures/Studies: CT ABDOMEN PELVIS WO CONTRAST  Result Date: 01/06/2020 CLINICAL DATA:  Abdominal pain. EXAM: CT ABDOMEN AND PELVIS WITHOUT CONTRAST TECHNIQUE: Multidetector CT imaging of the abdomen and pelvis was performed following the standard protocol without IV contrast. COMPARISON:  June 06, 2017. FINDINGS: Lower chest: No acute abnormality. Hepatobiliary: Status post cholecystectomy. No biliary dilatation is noted. Stable large right renal cyst is noted. Pancreas: Unremarkable. No pancreatic ductal dilatation or surrounding inflammatory changes. Spleen: Normal in size without focal abnormality. Adrenals/Urinary Tract: Stable bilateral adrenal adenomas. Status post right nephrectomy. Left kidney and ureter are unremarkable. No hydronephrosis or renal obstruction is noted. Urinary bladder is decompressed. Stomach/Bowel: The stomach appears normal. No colonic dilatation is noted. Postsurgical changes are seen involving  the right colon. Severe proximal small bowel dilatation is noted which appears to be due to small bowel loops within the large periumbilical hernia resulting in obstruction. More distal small bowel loops are nondilated. Vascular/Lymphatic: Aortic atherosclerosis. No enlarged abdominal or pelvic lymph nodes. Reproductive: Uterus and bilateral adnexa are unremarkable. Other: No ascites is noted.  No abnormal fluid collection is noted. Musculoskeletal: No acute or significant osseous findings. IMPRESSION: 1. Severe proximal small bowel dilatation is noted which appears to be due to small bowel loops within the large  periumbilical hernia resulting in obstruction. More distal small bowel loops are nondilated. 2. Stable bilateral adrenal adenomas. 3. Aortic atherosclerosis. Aortic Atherosclerosis (ICD10-I70.0). Electronically Signed   By: Marijo Conception M.D.   On: 01/06/2020 14:51   DG Abd Portable 1V-Small Bowel Obstruction Protocol-initial, 8 hr delay  Addendum Date: 01/08/2020   ADDENDUM REPORT: 01/08/2020 22:40 ADDENDUM: These results were called by telephone at the time of interpretation on 01/08/2020 at 8:45pm to provider Nurse Izora Gala on [facility] , who verbally acknowledged these results. Electronically Signed   By: Iven Finn M.D.   On: 01/08/2020 22:40   Result Date: 01/08/2020 CLINICAL DATA:  Small-bowel obstruction 8 hours delay next EXAM: PORTABLE ABDOMEN - 1 VIEW COMPARISON:  X-ray abdomen 01/07/2020. FINDINGS: Enteric tube with tip overlying the expected region of the gastroesophageal junction and side port within the distal esophagus. Similar-appearing gaseous dilatation of several loops of small bowel measuring up to 4 cm in caliber. PO contrast noted to reach the rectum. Surgical clips and bowel anastomosis staples overlie the abdomen. No radio-opaque calculi or other significant radiographic abnormality are seen. Visualized lower thoraces demonstrate no focal consolidation or pleural effusion. IMPRESSION: 1. Findings suggestive of a partial small bowel obstruction or ileus with PO contrast reaching the rectum. 2. Of note, enteric tube with tip overlying the expected region of the gastroesophageal junction and side port within the distal esophagus. Recommend advancing by at least 10 cm. Electronically Signed: By: Iven Finn M.D. On: 01/08/2020 20:41   DG Abd Portable 1V-Small Bowel Protocol-Position Verification  Result Date: 01/07/2020 CLINICAL DATA:  Nasogastric tube placement. EXAM: PORTABLE ABDOMEN - 1 VIEW COMPARISON:  January 06, 2020. FINDINGS: Mildly dilated small bowel loops are noted  concerning for distal small bowel obstruction. No colonic dilatation is noted. Distal tip of nasogastric tube is just beyond the gastroesophageal junction with side hole in the distal esophagus. This is not significantly changed compared to prior exam. IMPRESSION: Distal tip of nasogastric tube is just beyond the gastroesophageal junction with side hole in distal esophagus. This is not significantly changed compared to prior exam. Mildly dilated small bowel loops are noted concerning for distal small bowel obstruction. Electronically Signed   By: Marijo Conception M.D.   On: 01/07/2020 09:26   DG Abd Portable 1 View  Result Date: 01/06/2020 CLINICAL DATA:  NG tube placement EXAM: PORTABLE ABDOMEN - 1 VIEW COMPARISON:  None. FINDINGS: The bowel gas pattern is normal. Tip the NG tube is seen within the stomach the side hole appears to be the distal esophagus. Surgical clips seen in the mid abdomen. IMPRESSION: Tip the NG tube just entering the stomach and side likely within the distal esophagus. Electronically Signed   By: Prudencio Pair M.D.   On: 01/06/2020 19:14      Subjective: No nausea, vomiting. Tolerating  Discharge Exam: Vitals:   01/12/20 0545 01/12/20 0754  BP: (!) 188/55 (!) 169/56  Pulse: 64 69  Resp:  17   Temp: 99.5 F (37.5 C)   SpO2: 94%    Vitals:   01/11/20 1311 01/11/20 2102 01/12/20 0545 01/12/20 0754  BP: (!) 178/54 (!) 184/53 (!) 188/55 (!) 169/56  Pulse: 63 74 64 69  Resp: 18 17 17    Temp: 98.8 F (37.1 C) 99.1 F (37.3 C) 99.5 F (37.5 C)   TempSrc: Oral Oral Oral   SpO2: 94% 93% 94%   Weight:      Height:        General exam: Appears calm and comfortable Respiratory system: Clear to auscultation. Respiratory effort normal. Cardiovascular system: S1 & S2 heard, RRR. No murmurs, rubs, gallops or clicks. Gastrointestinal system: Abdomen is nondistended, soft and tender around hernia. No organomegaly or masses felt. Normal bowel sounds heard. Central nervous  system: Alert and oriented. No focal neurological deficits. Musculoskeletal: No edema. No calf tenderness Skin: No cyanosis. No rashes Psychiatry: Judgement and insight appear normal. Depressed mood, flat affect.     The results of significant diagnostics from this hospitalization (including imaging, microbiology, ancillary and laboratory) are listed below for reference.     Microbiology: Recent Results (from the past 240 hour(s))  SARS Coronavirus 2 by RT PCR (hospital order, performed in [facility] hospital lab) Nasopharyngeal Nasopharyngeal Swab     Status: None   Collection Time: 01/06/20  2:28 PM   Specimen: Nasopharyngeal Swab  Result Value Ref Range Status   SARS Coronavirus 2 NEGATIVE NEGATIVE Final    Comment: (NOTE) SARS-CoV-2 target nucleic acids are NOT DETECTED.  The SARS-CoV-2 RNA is generally detectable in upper and lower respiratory specimens during the acute phase of infection. The lowest concentration of SARS-CoV-2 viral copies this assay can detect is 250 copies / mL. A negative result does not preclude SARS-CoV-2 infection and should not be used as the sole basis for treatment or other patient management decisions.  A negative result may occur with improper specimen collection / handling, submission of specimen other than nasopharyngeal swab, presence of viral mutation(s) within the areas targeted by this assay, and inadequate number of viral copies (<250 copies / mL). A negative result must be combined with clinical observations, patient history, and epidemiological information.  Fact Sheet for Patients:   StrictlyIdeas.no  Fact Sheet for Healthcare Providers: BankingDealers.co.za  This test is not yet approved or  cleared by the [place] FDA and has been authorized for detection and/or diagnosis of SARS-CoV-2 by FDA under an Emergency Use Authorization (EUA).  This EUA will remain in effect (meaning  this test can be used) for the duration of the COVID-19 declaration under Section 564(b)(1) of the Act, 21 U.S.C. section 360bbb-3(b)(1), unless the authorization is terminated or revoked sooner.  Performed at [facility], [place] [address], [place], [place] [address]      Labs: BNP (last 3 results) Recent Labs    01/06/20 1948  BNP 16.0   Basic Metabolic Panel: Recent Labs  Lab 01/06/20 0005 01/07/20 0507 01/10/20 1405  NA 141 140 137  K 3.9 3.9 3.6  CL 103 104 103  CO2 28 27 23   GLUCOSE 121* 89 99  BUN 13 24* 10  CREATININE 0.85 0.75 0.59  CALCIUM 9.5 8.8* 7.8*   Liver Function Tests: Recent Labs  Lab 01/06/20 0005  AST 17  ALT 12  ALKPHOS 76  BILITOT 0.8  PROT 7.4  ALBUMIN 3.9   Recent Labs  Lab 01/06/20 0005  LIPASE 23   No results for  input(s): AMMONIA in the last 168 hours. CBC: Recent Labs  Lab 01/06/20 0005 01/07/20 0507 01/10/20 1405  WBC 11.4* 11.6* 10.7*  HGB 14.1 12.7 12.2  HCT 44.3 39.6 38.1  MCV 101.4* 100.8* 100.3*  PLT 380 343 260   Cardiac Enzymes: No results for input(s): CKTOTAL, CKMB, CKMBINDEX, TROPONINI in the last 168 hours. BNP: Invalid input(s): POCBNP CBG: No results for input(s): GLUCAP in the last 168 hours. D-Dimer No results for input(s): DDIMER in the last 72 hours. Hgb A1c No results for input(s): HGBA1C in the last 72 hours. Lipid Profile No results for input(s): CHOL, HDL, LDLCALC, TRIG, CHOLHDL, LDLDIRECT in the last 72 hours. Thyroid function studies No results for input(s): TSH, T4TOTAL, T3FREE, THYROIDAB in the last 72 hours.  Invalid input(s): FREET3 Anemia work up No results for input(s): VITAMINB12, FOLATE, FERRITIN, TIBC, IRON, RETICCTPCT in the last 72 hours. Urinalysis    Component Value Date/Time   COLORURINE AMBER (A) 01/06/2020 1330   APPEARANCEUR HAZY (A) 01/06/2020 1330   LABSPEC 1.027 01/06/2020 1330   PHURINE 5.0 01/06/2020 1330   GLUCOSEU NEGATIVE 01/06/2020 1330   HGBUR  NEGATIVE 01/06/2020 1330   BILIRUBINUR NEGATIVE 01/06/2020 1330   KETONESUR 5 (A) 01/06/2020 1330   PROTEINUR 30 (A) 01/06/2020 1330   NITRITE NEGATIVE 01/06/2020 1330   LEUKOCYTESUR NEGATIVE 01/06/2020 1330   Sepsis Labs Invalid input(s): PROCALCITONIN,  WBC,  LACTICIDVEN Microbiology Recent Results (from the past 240 hour(s))  SARS Coronavirus 2 by RT PCR (hospital order, performed in [place] hospital lab) Nasopharyngeal Nasopharyngeal Swab     Status: None   Collection Time: 01/06/20  2:28 PM   Specimen: Nasopharyngeal Swab  Result Value Ref Range Status   SARS Coronavirus 2 NEGATIVE NEGATIVE Final    Comment: (NOTE) SARS-CoV-2 target nucleic acids are NOT DETECTED.  The SARS-CoV-2 RNA is generally detectable in upper and lower respiratory specimens during the acute phase of infection. The lowest concentration of SARS-CoV-2 viral copies this assay can detect is 250 copies / mL. A negative result does not preclude SARS-CoV-2 infection and should not be used as the sole basis for treatment or other patient management decisions.  A negative result may occur with improper specimen collection / handling, submission of specimen other than nasopharyngeal swab, presence of viral mutation(s) within the areas targeted by this assay, and inadequate number of viral copies (<250 copies / mL). A negative result must be combined with clinical observations, patient history, and epidemiological information.  Fact Sheet for Patients:   StrictlyIdeas.no  Fact Sheet for Healthcare Providers: BankingDealers.co.za  This test is not yet approved or  cleared by the [place] FDA and has been authorized for detection and/or diagnosis of SARS-CoV-2 by FDA under an Emergency Use Authorization (EUA).  This EUA will remain in effect (meaning this test can be used) for the duration of the COVID-19 declaration under Section 564(b)(1) of the Act, 21  U.S.C. section 360bbb-3(b)(1), unless the authorization is terminated or revoked sooner.  Performed at [facility], [place] [address], [place], [place] [address]      Time coordinating discharge: 35 minutes  SIGNED:   Cordelia Poche, MD [facility] 01/12/2020, 1:19 PM

## 2020-01-12 NOTE — Plan of Care (Signed)

## 2020-01-26 ENCOUNTER — Ambulatory Visit: Payer: Self-pay | Admitting: Surgery

## 2020-04-07 ENCOUNTER — Other Ambulatory Visit (HOSPITAL_COMMUNITY)
Admission: RE | Admit: 2020-04-07 | Discharge: 2020-04-07 | Disposition: A | Discharge status: Home / Self Care | Payer: Medicare Other | Source: Ambulatory Visit | Attending: Surgery | Admitting: Surgery

## 2020-04-07 ENCOUNTER — Other Ambulatory Visit: Payer: Self-pay

## 2020-04-07 ENCOUNTER — Encounter (HOSPITAL_COMMUNITY)
Admission: RE | Admit: 2020-04-07 | Discharge: 2020-04-07 | Disposition: A | Discharge status: Home / Self Care | Payer: Medicare Other | Source: Ambulatory Visit | Attending: Surgery | Admitting: Surgery

## 2020-04-07 ENCOUNTER — Encounter (HOSPITAL_COMMUNITY): Payer: Self-pay

## 2020-04-07 DIAGNOSIS — Z20822 Contact with and (suspected) exposure to covid-19: Secondary | ICD-10-CM | POA: Insufficient documentation

## 2020-04-07 DIAGNOSIS — Z01812 Encounter for preprocedural laboratory examination: Secondary | ICD-10-CM | POA: Insufficient documentation

## 2020-04-07 HISTORY — DX: Pneumonia, unspecified organism: J18.9

## 2020-04-07 HISTORY — DX: Unspecified osteoarthritis, unspecified site: M19.90

## 2020-04-07 LAB — CBC
HCT: 41.9 % (ref 36.0–46.0)
Hemoglobin: 13.1 g/dL (ref 12.0–15.0)
MCH: 31.9 pg (ref 26.0–34.0)
MCHC: 31.3 g/dL (ref 30.0–36.0)
MCV: 101.9 fL — ABNORMAL HIGH (ref 80.0–100.0)
Platelets: 349 10*3/uL (ref 150–400)
RBC: 4.11 MIL/uL (ref 3.87–5.11)
RDW: 13.3 % (ref 11.5–15.5)
WBC: 6.3 10*3/uL (ref 4.0–10.5)
nRBC: 0 % (ref 0.0–0.2)

## 2020-04-07 LAB — SARS CORONAVIRUS 2 (TAT 6-24 HRS): SARS Coronavirus 2: NEGATIVE

## 2020-04-07 LAB — BASIC METABOLIC PANEL
Anion gap: 11 (ref 5–15)
BUN: 8 mg/dL (ref 8–23)
CO2: 26 mmol/L (ref 22–32)
Calcium: 9.1 mg/dL (ref 8.9–10.3)
Chloride: 103 mmol/L (ref 98–111)
Creatinine, Ser: 0.65 mg/dL (ref 0.44–1.00)
GFR, Estimated: >60 mL/min (ref >60)
Glucose, Bld: 95 mg/dL (ref 70–99)
Potassium: 4.1 mmol/L (ref 3.5–5.1)
Sodium: 140 mmol/L (ref 135–145)

## 2020-04-07 NOTE — Progress Notes (Signed)
PCP - Dr. Dineen Kid Cardiologist - Dr. Ricky Ala  PPM/ICD - Denies  Chest x-ray - N/A EKG - 01/06/20 Stress Test - Denies ECHO - Denies Cardiac Cath - Denies  Sleep Study - Denies  Patient denies having diabetes.  Blood Thinner Instructions: N/A Aspirin Instructions: Patient to follow surgeon's  Instructions.  ERAS Protcol - No  COVID TEST- 04/07/20  Anesthesia review: Yes, cardiac hx  Patient denies shortness of breath, fever, cough and chest pain at PAT appointment   All instructions explained to the patient, with a verbal understanding of the material. Patient agrees to go over the instructions while at home for a better understanding. Patient also instructed to self quarantine after being tested for COVID-19. The opportunity to ask questions was provided.

## 2020-04-07 NOTE — Progress Notes (Signed)
Your procedure is scheduled on Monday, April 11, 2020.  Report to [facility] Main Entrance "A" at 5:30 A.M., and check in at the Admitting office.  Call this number if you have problems the morning of surgery:  [phone]  Call [phone] if you have any questions prior to your surgery date Monday-Friday 8am-4pm    Remember:  Do not eat or drink after midnight the night before your surgery   Take these medicines the morning of surgery with A SIP OF WATER:   ALPRAZolam (XANAX) atenolol (TENORMIN) predniSONE (DELTASONE)  IF NEEDED: acetaminophen (TYLENOL) albuterol (VENTOLIN HFA) Carboxymethylcellul-Glycerin (EQ LUBRICATING EYE DROPS OP) nitroGLYCERIN (NITROSTAT)  Please bring all inhalers with you the day of surgery.   Follow your surgeon's instructions on when to stop Aspirin.  If no instructions were given by your surgeon then you will need to call the office to get those instructions.     As of today, STOP taking any Aleve, Naproxen, Ibuprofen, Motrin, Advil, Goody's, BC's, all herbal medications, fish oil, and all vitamins.                      Do not wear jewelry, make up, or nail polish            Do not wear lotions, powders, perfumes, or deodorant.            Do not shave 48 hours prior to surgery.            Do not bring valuables to the hospital.            [facility] is not responsible for any belongings or valuables.  Do NOT Smoke (Tobacco/Vaping) or drink Alcohol 24 hours prior to your procedure If you use a CPAP at night, you may bring all equipment for your overnight stay.   Contacts, glasses, dentures or bridgework may not be worn into surgery.      For patients admitted to the hospital, discharge time will be determined by your treatment team.   Patients discharged the day of surgery will not be allowed to drive home, and someone needs to stay with them for 24 hours.    Special instructions:   - Preparing For Surgery  Before  surgery, you can play an important role. Because skin is not sterile, your skin needs to be as free of germs as possible. You can reduce the number of germs on your skin by washing with CHG (chlorahexidine gluconate) Soap before surgery.  CHG is an antiseptic cleaner which kills germs and bonds with the skin to continue killing germs even after washing.    Oral Hygiene is also important to reduce your risk of infection.  Remember - BRUSH YOUR TEETH THE MORNING OF SURGERY WITH YOUR REGULAR TOOTHPASTE  Please do not use if you have an allergy to CHG or antibacterial soaps. If your skin becomes reddened/irritated stop using the CHG.  Do not shave (including legs and underarms) for at least 48 hours prior to first CHG shower. It is OK to shave your face.  Please follow these instructions carefully.   1. Shower the NIGHT BEFORE SURGERY and the MORNING OF SURGERY with CHG Soap.   2. If you chose to wash your hair, wash your hair first as usual with your normal shampoo.  3. After you shampoo, rinse your hair and body thoroughly to remove the shampoo.  4. Use CHG as you would any other liquid soap. You can apply  CHG directly to the skin and wash gently with a scrungie or a clean washcloth.   5. Apply the CHG Soap to your body ONLY FROM THE NECK DOWN.  Do not use on open wounds or open sores. Avoid contact with your eyes, ears, mouth and genitals (private parts). Wash Face and genitals (private parts)  with your normal soap.   6. Wash thoroughly, paying special attention to the area where your surgery will be performed.  7. Thoroughly rinse your body with warm water from the neck down.  8. DO NOT shower/wash with your normal soap after using and rinsing off the CHG Soap.  9. Pat yourself dry with a CLEAN TOWEL.  10. Wear CLEAN PAJAMAS to bed the night before surgery  11. Place CLEAN SHEETS on your bed the night of your first shower and DO NOT SLEEP WITH PETS.   Day of Surgery: Wear  Clean/Comfortable clothing the morning of surgery Do not apply any deodorants/lotions.   Remember to brush your teeth WITH YOUR REGULAR TOOTHPASTE.   Please read over the following fact sheets that you were given.

## 2020-04-08 ENCOUNTER — Ambulatory Visit (HOSPITAL_COMMUNITY): Payer: Medicare Other | Admitting: Vascular Surgery

## 2020-04-08 ENCOUNTER — Ambulatory Visit (HOSPITAL_COMMUNITY): Payer: Medicare Other | Admitting: Anesthesiology

## 2020-04-08 ENCOUNTER — Encounter (HOSPITAL_COMMUNITY): Payer: Self-pay

## 2020-04-08 NOTE — Anesthesia Preprocedure Evaluation (Deleted)
Anesthesia Evaluation  Patient identified by MRN, date of birth, ID band Patient awake    Reviewed: Allergy & Precautions, H&P , NPO status , Patient's Chart, lab work & pertinent test results  Airway Mallampati: II       Dental  (+) Edentulous Upper, Upper Dentures, Poor Dentition, Chipped, Missing, Loose,    Pulmonary neg pulmonary ROS, COPD, Current Smoker and Patient abstained from smoking.,           Cardiovascular Exercise Tolerance: Good hypertension, Pt. on medications and Pt. on home beta blockers + CAD and + Past MI  negative cardio ROS       Neuro/Psych PSYCHIATRIC DISORDERS Anxiety Depression CVA negative neurological ROS  negative psych ROS   GI/Hepatic negative GI ROS, Neg liver ROS,   Endo/Other  negative endocrine ROS  Renal/GU Renal diseasenegative Renal ROS  negative genitourinary   Musculoskeletal negative musculoskeletal ROS (+)   Abdominal   Peds negative pediatric ROS (+)  Hematology negative hematology ROS (+)   Anesthesia Other Findings   Reproductive/Obstetrics negative OB ROS                           Anesthesia Physical Anesthesia Plan  ASA: III  Anesthesia Plan: General   Post-op Pain Management:    Induction: Intravenous  PONV Risk Score and Plan: 3 and Ondansetron and Treatment may vary due to age or medical condition  Airway Management Planned: Oral ETT and LMA  Additional Equipment: None  Intra-op Plan:   Post-operative Plan: Extubation in OR  Informed Consent: I have reviewed the patients History and Physical, chart, labs and discussed the procedure including the risks, benefits and alternatives for the proposed anesthesia with the patient or authorized representative who has indicated his/her understanding and acceptance.       Plan Discussed with: Anesthesiologist  Anesthesia Plan Comments: (PAT note written 04/08/2020 by Myra Gianotti, PA-C.  Patient is a 74 year old female scheduled for the above procedure. She was hospitalized 01/05/20-01/12/20 for SBO due to periumbilical hernia unable to reduce in ED. Cardiology consulted for preoperative evaluation given anticipation for surgery; however, general surgery able to reduce the hernia, and patient did not desire surgery at that time, so treated with NGT, bowel rest, abdominal binder. Symptoms improved and gradually diet advanced and NGT removed.   History includes smoking, HTN, CAD (NSTEMI, s/p BMS CX, EF 45-50% 2015, "normal" echo 2017 by Dr. Donnetta Hutching notes), CVA, colon cancer (s/p right hemicolectomy 06/2012), right renal cancer (s/p right nephrectomy 11/16/03), COPD (as needed home O2 2L), bowel obstruction (due to periumbilical hernia 04/270).  As above, in-patient cardiology consult in September 2021 for anticipated hernia repair. Initially evaluated on 01/06/20 by Larae Grooms, MD. At that time, surgery on 01/07/20 was anticipated. He wrote, "...Given her age and prior history of heart problems, she has an intermediate risk of cardiac complications during the perioperative period, approximately 5%.Marland KitchenMarland Kitchen"At last visit on 01/07/20, she had not had surgery, but noted still likely and added, "CAD: No angina. No CHF. No further testing needed before surgery."  According to records in [place], she had a NSTEMI, s/p BMS to CX in 2015 with EF 45-50% then with follow-up echo in 2017 "normal". I will attempt to get copies as the reports can not be viewed in Care Everywhere. BP was elevated at PAT--no repeat BP is documented. She is on atenolol BID and was instructed to take on the morning of surgery.  12/21/19 primary care notes indicate that she is on prednisone for bilateral sacroiliitis.   04/07/2020 presurgical COVID-19 test negative.  Anesthesia team to evaluate on the day of surgery. Vitals on arrival. )      Anesthesia Quick Evaluation

## 2020-04-08 NOTE — Progress Notes (Signed)
Anesthesia Chart Review:  Case: [id number] Date/Time: 04/11/20 0715   Procedure: LAPAROSCOPIC VENTRAL HERNIA VS OPEN (N/A )   Anesthesia type: General   Pre-op diagnosis: VENTRAL HERNIA   [facility] / [facility]   Surgeons: Jesusita Oka, MD      DISCUSSION: Patient is a 74 year old female scheduled for the above procedure. She was hospitalized 01/05/20-01/12/20 for SBO due to periumbilical hernia unable to reduce in ED. Cardiology consulted for preoperative evaluation given anticipation for surgery; however, general surgery able to reduce the hernia, and patient did not desire surgery at that time, so treated with NGT, bowel rest, abdominal binder. Symptoms improved and gradually diet advanced and NGT removed.   History includes smoking, HTN, CAD (NSTEMI, s/p BMS CX, EF 45-50% 2015, "normal" echo 2017 by Dr. Donnetta Hutching notes), CVA, colon cancer (s/p right hemicolectomy 06/2012), right renal cancer (s/p right nephrectomy 11/16/03), COPD (as needed home O2 2L), bowel obstruction (due to periumbilical hernia 07/8183).  As above, in-patient cardiology consult in September 2021 for anticipated hernia repair. Initially evaluated on 01/06/20 by Larae Grooms, MD. At that time, surgery on 01/07/20 was anticipated. He wrote, "...Given her age and prior history of heart problems, she has an intermediate risk of cardiac complications during the perioperative period,  approximately 5%..." At last visit on 01/07/20, she had not had surgery, but noted still likely and added, "CAD: No angina.  No CHF.  No further testing needed before surgery."  According to records in [place], she had a NSTEMI, s/p BMS to CX in 2015 with EF 45-50% then with follow-up echo in 2017 "normal". I will attempt to get copies as the reports can not be viewed in Care Everywhere. BP was elevated at PAT--no repeat BP is documented. She is on atenolol BID and was instructed to take on the morning of surgery.   12/21/19 primary care  notes indicate that she is on prednisone for bilateral sacroiliitis.   04/07/2020 presurgical COVID-19 test negative.  Anesthesia team to evaluate on the day of surgery. Vitals on arrival.    VS: BP (!) 190/56   Pulse 72   Temp 37.4 C (Oral)   Resp 18   Ht 5' (1.524 m)   Wt 54.5 kg   SpO2 99%   BMI 23.47 kg/m   BP Readings from Last 3 Encounters:  04/07/20 (!) 190/56  01/12/20 (!) 173/50    PROVIDERS: Dineen Kid, MD is PCP ([place]) Ricky Ala, MD is cardiologist. Last visit noted 08/23/16 [facility]). Evaluated inpatient ([place]) 12/2019 by Larae Grooms, MD.   LABS: Labs reviewed: Acceptable for surgery. (all labs ordered are listed, but only abnormal results are displayed)  Labs Reviewed  CBC - Abnormal; Notable for the following components:      Result Value   MCV 101.9 (*)    All other components within normal limits  BASIC METABOLIC PANEL     EKG: 6/31/49: NSR   CV: See DISCUSSION.  Echo 06/08/15: Result not viewable in [facility] or [place], but per 08/23/16 cardiology note by Dr[facility]net[facility] Hutching [facility]), "Echocardiogram done last year was normal."    Past Medical History:  Diagnosis Date  . Anxiety and depression   . Arthritis   . Bowel obstruction ([place]) 12/2019  . CAD (coronary artery disease) 2014  . Cancer of kidney, right ([place])   . Colon cancer ([place])   . COPD (chronic obstructive pulmonary disease) ([place])   . CVA (  cerebral vascular accident) ([place])   . Hypertension   . Myocardial infarction ([place]) 2015  . Pneumonia     Past Surgical History:  Procedure Laterality Date  . APPENDECTOMY    . CHOLECYSTECTOMY    . COLECTOMY    . CORONARY ANGIOPLASTY     BMS CX 2015 per Dr. Donnetta Hutching notes  . EYE SURGERY    . NEPHRECTOMY      MEDICATIONS: . acetaminophen (TYLENOL) 500 MG tablet  . albuterol (VENTOLIN HFA) 108 (90 Base) MCG/ACT inhaler  . ALPRAZolam (XANAX) 0.25 MG tablet  . aspirin 81 MG EC tablet  . atenolol (TENORMIN) 25 MG tablet   . Carboxymethylcellul-Glycerin (EQ LUBRICATING EYE DROPS OP)  . Cyanocobalamin (B-12 PO)  . Docusate Calcium (STOOL SOFTENER PO)  . nitroGLYCERIN (NITROSTAT) 0.4 MG SL tablet  . OXYGEN  . predniSONE (DELTASONE) 5 MG tablet   No current facility-administered medications for this encounter.     Myra Gianotti, PA-C Surgical Short Stay/Anesthesiology [facility] Phone [phone] [facility] Phone [phone] 04/08/2020 11:48 AM

## 2020-04-11 ENCOUNTER — Encounter (HOSPITAL_COMMUNITY): Payer: Self-pay | Admitting: Surgery

## 2020-04-11 ENCOUNTER — Encounter (HOSPITAL_COMMUNITY)
Admission: RE | Disposition: A | Discharge status: Home / Self Care | Payer: Self-pay | Source: Home / Self Care | Attending: Surgery

## 2020-04-11 ENCOUNTER — Ambulatory Visit (HOSPITAL_COMMUNITY)
Admission: RE | Admit: 2020-04-11 | Discharge: 2020-04-11 | Disposition: A | Discharge status: Home / Self Care | Payer: Medicare Other | Attending: Surgery | Admitting: Surgery

## 2020-04-11 ENCOUNTER — Other Ambulatory Visit: Payer: Self-pay

## 2020-04-11 DIAGNOSIS — K432 Incisional hernia without obstruction or gangrene: Secondary | ICD-10-CM | POA: Insufficient documentation

## 2020-04-11 DIAGNOSIS — Z538 Procedure and treatment not carried out for other reasons: Secondary | ICD-10-CM | POA: Diagnosis not present

## 2020-04-11 SURGERY — REPAIR, HERNIA, VENTRAL, LAPAROSCOPIC
Anesthesia: General

## 2020-04-11 MED ORDER — ORAL CARE MOUTH RINSE: 15.0000 mL | Freq: Once | OROMUCOSAL | Status: DC

## 2020-04-11 MED ORDER — CHLORHEXIDINE GLUCONATE CLOTH 2 % EX PADS: 6.0000 | MEDICATED_PAD | Freq: Once | CUTANEOUS | Status: DC

## 2020-04-11 MED ORDER — PROPOFOL 10 MG/ML IV BOLUS
INTRAVENOUS | Status: AC
  Filled 2020-04-11: qty 20

## 2020-04-11 MED ORDER — BUPIVACAINE HCL (PF) 0.25 % IJ SOLN
INTRAMUSCULAR | Status: AC
  Filled 2020-04-11: qty 30

## 2020-04-11 MED ORDER — VANCOMYCIN HCL IN DEXTROSE 1-5 GM/200ML-% IV SOLN
1000.0000 mg | INTRAVENOUS | Status: DC
  Filled 2020-04-11: qty 200

## 2020-04-11 MED ORDER — LACTATED RINGERS IV SOLN: INTRAVENOUS | Status: DC

## 2020-04-11 MED ORDER — FENTANYL CITRATE (PF) 250 MCG/5ML IJ SOLN
INTRAMUSCULAR | Status: AC
  Filled 2020-04-11: qty 5

## 2020-04-11 MED ORDER — ENOXAPARIN SODIUM 40 MG/0.4ML ~~LOC~~ SOLN
40.0000 mg | Freq: Once | SUBCUTANEOUS | Status: DC
  Filled 2020-04-11: qty 0.4

## 2020-04-11 MED ORDER — CHLORHEXIDINE GLUCONATE 0.12 % MT SOLN
15.0000 mL | Freq: Once | OROMUCOSAL | Status: DC
  Filled 2020-04-11: qty 15

## 2020-04-11 MED ORDER — ACETAMINOPHEN 500 MG PO TABS
1000.0000 mg | ORAL_TABLET | ORAL | Status: DC
  Filled 2020-04-11: qty 2

## 2020-04-11 MED ORDER — MIDAZOLAM HCL 2 MG/2ML IJ SOLN
INTRAMUSCULAR | Status: AC
  Filled 2020-04-11: qty 2

## 2020-04-11 SURGICAL SUPPLY — 45 items
BINDER ABDOMINAL 12 ML 46-62 (SOFTGOODS) IMPLANT
CANISTER SUCT 3000ML PPV (MISCELLANEOUS) IMPLANT
CHLORAPREP W/TINT 26 (MISCELLANEOUS) ×3 IMPLANT
COVER SURGICAL LIGHT HANDLE (MISCELLANEOUS) ×3 IMPLANT
COVER WAND RF STERILE (DRAPES) ×3 IMPLANT
DERMABOND ADVANCED (GAUZE/BANDAGES/DRESSINGS) ×2
DERMABOND ADVANCED .7 DNX12 (GAUZE/BANDAGES/DRESSINGS) ×1 IMPLANT
DEVICE SECURE STRAP 25 ABSORB (INSTRUMENTS) IMPLANT
DEVICE TROCAR PUNCTURE CLOSURE (ENDOMECHANICALS) IMPLANT
ELECT CAUTERY BLADE 6.4 (BLADE) ×3 IMPLANT
ELECT REM PT RETURN 9FT ADLT (ELECTROSURGICAL) ×3
ELECTRODE REM PT RTRN 9FT ADLT (ELECTROSURGICAL) ×1 IMPLANT
GLOVE BIO SURGEON STRL SZ 6.5 (GLOVE) ×2 IMPLANT
GLOVE BIO SURGEONS STRL SZ 6.5 (GLOVE) ×1
GLOVE BIOGEL PI IND STRL 6 (GLOVE) ×1 IMPLANT
GLOVE BIOGEL PI INDICATOR 6 (GLOVE) ×2
GOWN STRL REUS W/ TWL LRG LVL3 (GOWN DISPOSABLE) ×3 IMPLANT
GOWN STRL REUS W/TWL LRG LVL3 (GOWN DISPOSABLE) ×6
GRASPER SUT TROCAR 14GX15 (MISCELLANEOUS) IMPLANT
KIT BASIN OR (CUSTOM PROCEDURE TRAY) ×3 IMPLANT
KIT TURNOVER KIT B (KITS) ×3 IMPLANT
MARKER SKIN DUAL TIP RULER LAB (MISCELLANEOUS) ×3 IMPLANT
NEEDLE SPNL 22GX3.5 QUINCKE BK (NEEDLE) IMPLANT
NS IRRIG 1000ML POUR BTL (IV SOLUTION) ×3 IMPLANT
PAD ARMBOARD 7.5X6 YLW CONV (MISCELLANEOUS) ×6 IMPLANT
PENCIL BUTTON HOLSTER BLD 10FT (ELECTRODE) ×3 IMPLANT
SCISSORS LAP 5X35 DISP (ENDOMECHANICALS) IMPLANT
SET IRRIG TUBING LAPAROSCOPIC (IRRIGATION / IRRIGATOR) IMPLANT
SET TUBE SMOKE EVAC HIGH FLOW (TUBING) ×3 IMPLANT
SHEARS HARMONIC ACE PLUS 36CM (ENDOMECHANICALS) IMPLANT
SLEEVE ENDOPATH XCEL 5M (ENDOMECHANICALS) ×3 IMPLANT
SUT ETHIBOND CT1 BRD #0 30IN (SUTURE) ×6 IMPLANT
SUT MNCRL AB 4-0 PS2 18 (SUTURE) ×3 IMPLANT
SUT PROLENE 0 CT 1 CR/8 (SUTURE) ×3 IMPLANT
SUT VIC AB 3-0 SH 27 (SUTURE) ×2
SUT VIC AB 3-0 SH 27XBRD (SUTURE) ×1 IMPLANT
TOWEL GREEN STERILE (TOWEL DISPOSABLE) ×3 IMPLANT
TOWEL GREEN STERILE FF (TOWEL DISPOSABLE) ×3 IMPLANT
TRAY FOLEY W/BAG SLVR 16FR (SET/KITS/TRAYS/PACK)
TRAY FOLEY W/BAG SLVR 16FR ST (SET/KITS/TRAYS/PACK) IMPLANT
TRAY LAPAROSCOPIC MC (CUSTOM PROCEDURE TRAY) ×3 IMPLANT
TROCAR XCEL BLUNT TIP 100MML (ENDOMECHANICALS) IMPLANT
TROCAR XCEL NON-BLD 11X100MML (ENDOMECHANICALS) IMPLANT
TROCAR XCEL NON-BLD 5MMX100MML (ENDOMECHANICALS) ×3 IMPLANT
WATER STERILE IRR 1000ML POUR (IV SOLUTION) ×3 IMPLANT

## 2020-04-11 NOTE — H&P (Signed)
Christine Stuart is an 74 y.o. female.   HPI: 65F with symptomatic incisional hernia of the mid-abdomen. She presents this morning with no recollection of having ever met me or having discussed the procedure she is scheduled to have completed today. She reports being given medications that had previosuly been discontinued and subsequently being "out of her mind", describing symptoms of delirium inclusive of auditory and visual hallucinations. At this point, the person listed as her emergency contact, Christine Stuart, was brought back to pre-op to aid in obtaining a history. Christine Stuart confirms that these medications were given and the patient was quite delirious even after discontinuation of the medications. The patient states she lives alone with a caregiver about 10h/d Monday through Friday.   Past Medical History:  Diagnosis Date   Anxiety and depression    Arthritis    Bowel obstruction ([place]) 12/2019   CAD (coronary artery disease) 2014   Cancer of kidney, right (H[facility])    Colon cancer ([facility])    COPD (chronic obstructive pulmonary disease) ([facility])    CVA (cerebral vascular accident) ([place])    Hypertension    Myocardial infarction ([place]) 2015   Pneumonia     Past Surgical History:  Procedure Laterality Date   APPENDECTOMY     CHOLECYSTECTOMY     COLECTOMY     CORONARY ANGIOPLASTY     BMS CX 2015 per Dr. Donnetta Hutching notes   EYE SURGERY     NEPHRECTOMY      History reviewed. No pertinent family history.  Social History:  reports that she has been smoking. She has a 30.00 pack-year smoking history. She has never used smokeless tobacco. She reports that she does not drink alcohol and does not use drugs.  Allergies:  Allergies  Allergen Reactions   Codeine Swelling   Erythromycin Hives, Itching, Shortness Of Breath and Swelling   Iodinated Diagnostic Agents Hives, Itching, Shortness Of Breath and Swelling   Other Swelling and Other (See Comments)    Propoxyphene N-acetaminophen   Penicillins  Hives, Itching, Shortness Of Breath and Swelling   Sulfa Antibiotics Hives and Shortness Of Breath   Influenza Vaccines Nausea And Vomiting and Other (See Comments)    Fever; Pt stated she was sick for days   Naproxen Swelling and Other (See Comments)    Tongue and throat   Oxycodone-Acetaminophen Swelling   Pneumococcal Vaccine Nausea And Vomiting    Medications: I have reviewed the patient's current medications.  No results found for this or any previous visit (from the past 48 hour(s)).  No results found.  ROS 10 point review of systems is negative except as listed above in HPI.   Physical Exam Blood pressure (!) 209/65, pulse 66, temperature 98.3 F (36.8 C), temperature source Oral, resp. rate 17, height 5' (1.524 m), weight 54.5 kg, SpO2 98 %. Constitutional: well-developed, well-nourished HEENT: pupils equal, round, reactive to light, 50m b/l, moist conjunctiva, external inspection of ears and nose normal, hearing intact Oropharynx: normal oropharyngeal mucosa, poor dentition Neck: no thyromegaly, trachea midline, no midline cervical tenderness to palpation Chest: breath sounds equal bilaterally, normal respiratory effort, no midline or lateral chest wall tenderness to palpation/deformity Abdomen: soft, NT, central incisional hernia, reducible, no bruising, no hepatosplenomegaly GU: normal female genitalia  Back: no wounds, no thoracic/lumbar spine tenderness to palpation, no thoracic/lumbar spine stepoffs Rectal: deferred Extremities: 2+ radial and pedal pulses bilaterally, motor and sensation intact to bilateral UE and LE, no peripheral edema MSK: unable to assess gait/station,  no clubbing/cyanosis of fingers/toes, normal ROM of all four extremities Skin: warm, dry, no rashes Psych: normal mood/affect, limited memory    Assessment/Plan: 33F with symptomatic incisional hernia of the mid-abdomen, but recent history of medication induced delirium. I discussed in detail  with the patient and her emergency contact, Christine Stuart, regarding the risks of proceeding with surgery, including the possibility of worsened delirium secondary to anesthetic effect and need for inpatient admission post-operatively, as well as the risks and benefits of hernia repair. After this discussion, we made a joint decision to delay operative intervention. The patient desires to reschedule in mid to late January. We also discussed the need for completion of power of attorney for [employer] on behalf of the patient, as the patient states she has no living blood relatives. I also encouraged further discussion regarding code status and a living will. The patient and Christine Stuart were very open to this conversation and were provided a blank copy of a power of attorney and living will for their use.     Jesusita Oka, MD General and Hoople Surgery

## 2020-06-12 ENCOUNTER — Emergency Department (HOSPITAL_COMMUNITY): Payer: Medicare Other

## 2020-06-12 ENCOUNTER — Encounter (HOSPITAL_COMMUNITY): Payer: Self-pay | Admitting: *Deleted

## 2020-06-12 ENCOUNTER — Other Ambulatory Visit: Payer: Self-pay

## 2020-06-12 ENCOUNTER — Emergency Department (HOSPITAL_COMMUNITY)
Admission: EM | Admit: 2020-06-12 | Discharge: 2020-06-12 | Disposition: A | Discharge status: Home / Self Care | Payer: Medicare Other | Attending: Emergency Medicine | Admitting: Emergency Medicine

## 2020-06-12 DIAGNOSIS — K439 Ventral hernia without obstruction or gangrene: Secondary | ICD-10-CM | POA: Insufficient documentation

## 2020-06-12 DIAGNOSIS — Z20822 Contact with and (suspected) exposure to covid-19: Secondary | ICD-10-CM | POA: Diagnosis not present

## 2020-06-12 DIAGNOSIS — Z85038 Personal history of other malignant neoplasm of large intestine: Secondary | ICD-10-CM | POA: Insufficient documentation

## 2020-06-12 DIAGNOSIS — R945 Abnormal results of liver function studies: Secondary | ICD-10-CM | POA: Insufficient documentation

## 2020-06-12 DIAGNOSIS — Z79899 Other long term (current) drug therapy: Secondary | ICD-10-CM | POA: Insufficient documentation

## 2020-06-12 DIAGNOSIS — I25810 Atherosclerosis of coronary artery bypass graft(s) without angina pectoris: Secondary | ICD-10-CM | POA: Diagnosis not present

## 2020-06-12 DIAGNOSIS — R109 Unspecified abdominal pain: Secondary | ICD-10-CM | POA: Diagnosis present

## 2020-06-12 DIAGNOSIS — I119 Hypertensive heart disease without heart failure: Secondary | ICD-10-CM | POA: Diagnosis not present

## 2020-06-12 DIAGNOSIS — Z7982 Long term (current) use of aspirin: Secondary | ICD-10-CM | POA: Diagnosis not present

## 2020-06-12 DIAGNOSIS — R748 Abnormal levels of other serum enzymes: Secondary | ICD-10-CM

## 2020-06-12 DIAGNOSIS — I2510 Atherosclerotic heart disease of native coronary artery without angina pectoris: Secondary | ICD-10-CM | POA: Diagnosis not present

## 2020-06-12 DIAGNOSIS — F17200 Nicotine dependence, unspecified, uncomplicated: Secondary | ICD-10-CM | POA: Insufficient documentation

## 2020-06-12 DIAGNOSIS — Z85528 Personal history of other malignant neoplasm of kidney: Secondary | ICD-10-CM | POA: Diagnosis not present

## 2020-06-12 DIAGNOSIS — J449 Chronic obstructive pulmonary disease, unspecified: Secondary | ICD-10-CM | POA: Diagnosis not present

## 2020-06-12 LAB — RESP PANEL BY RT-PCR (FLU A&B, COVID) ARPGX2
Influenza A by PCR: NEGATIVE
Influenza B by PCR: NEGATIVE
SARS Coronavirus 2 by RT PCR: NEGATIVE

## 2020-06-12 LAB — COMPREHENSIVE METABOLIC PANEL
ALT: 185 U/L — ABNORMAL HIGH (ref 0–44)
AST: 655 U/L — ABNORMAL HIGH (ref 15–41)
Albumin: 3.4 g/dL — ABNORMAL LOW (ref 3.5–5.0)
Alkaline Phosphatase: 286 U/L — ABNORMAL HIGH (ref 38–126)
Anion gap: 10 (ref 5–15)
BUN: 16 mg/dL (ref 8–23)
CO2: 27 mmol/L (ref 22–32)
Calcium: 9.3 mg/dL (ref 8.9–10.3)
Chloride: 103 mmol/L (ref 98–111)
Creatinine, Ser: 0.78 mg/dL (ref 0.44–1.00)
GFR, Estimated: >60 mL/min (ref >60)
Glucose, Bld: 101 mg/dL — ABNORMAL HIGH (ref 70–99)
Potassium: 4.3 mmol/L (ref 3.5–5.1)
Sodium: 140 mmol/L (ref 135–145)
Total Bilirubin: 1.5 mg/dL — ABNORMAL HIGH (ref 0.3–1.2)
Total Protein: 6.5 g/dL (ref 6.5–8.1)

## 2020-06-12 LAB — CBC
HCT: 42.7 % (ref 36.0–46.0)
Hemoglobin: 14.3 g/dL (ref 12.0–15.0)
MCH: 33.1 pg (ref 26.0–34.0)
MCHC: 33.5 g/dL (ref 30.0–36.0)
MCV: 98.8 fL (ref 80.0–100.0)
Platelets: 342 10*3/uL (ref 150–400)
RBC: 4.32 MIL/uL (ref 3.87–5.11)
RDW: 13.5 % (ref 11.5–15.5)
WBC: 11.1 10*3/uL — ABNORMAL HIGH (ref 4.0–10.5)
nRBC: 0 % (ref 0.0–0.2)

## 2020-06-12 LAB — URINALYSIS, ROUTINE W REFLEX MICROSCOPIC
Bilirubin Urine: NEGATIVE
Glucose, UA: NEGATIVE mg/dL
Hgb urine dipstick: NEGATIVE
Ketones, ur: NEGATIVE mg/dL
Leukocytes,Ua: NEGATIVE
Nitrite: NEGATIVE
Protein, ur: NEGATIVE mg/dL
Specific Gravity, Urine: 1.019 (ref 1.005–1.030)
pH: 5 (ref 5.0–8.0)

## 2020-06-12 LAB — PROTIME-INR
INR: 1 (ref 0.8–1.2)
Prothrombin Time: 12.3 seconds (ref 11.4–15.2)

## 2020-06-12 LAB — HEPATITIS PANEL, ACUTE
HCV Ab: NONREACTIVE
Hep A IgM: NONREACTIVE
Hep B C IgM: NONREACTIVE
Hepatitis B Surface Ag: NONREACTIVE

## 2020-06-12 LAB — ACETAMINOPHEN LEVEL: Acetaminophen (Tylenol), Serum: <10 ug/mL — ABNORMAL LOW (ref 10–30)

## 2020-06-12 LAB — LIPASE, BLOOD: Lipase: 27 U/L (ref 11–51)

## 2020-06-12 MED ORDER — ONDANSETRON 4 MG PO TBDP: 4.0000 mg | ORAL_TABLET | Freq: Three times a day (TID) | ORAL | 0 refills | Status: AC | PRN

## 2020-06-12 MED ORDER — ONDANSETRON HCL 4 MG/2ML IJ SOLN
4.0000 mg | Freq: Once | INTRAMUSCULAR | Status: AC
  Administered 2020-06-12: 4 mg via INTRAVENOUS
  Filled 2020-06-12: qty 2

## 2020-06-12 NOTE — Discharge Instructions (Signed)
Your liver enzymes are elevated and there is potentially a mass in the liver.  Follow-up with your primary care doctor for further evaluation with an MRI.  Also follow-up with Dr. Bobbye Morton for the hernia.  You can reduce the hernia yourself if it is giving you problems.

## 2020-06-12 NOTE — ED Provider Notes (Signed)
[facility] EMERGENCY DEPARTMENT Provider Note   CSN: [id number] Arrival date & time: 06/12/20  0636     History Chief Complaint  Patient presents with  . Abdominal Pain    Christine Stuart is a 75 y.o. female.  HPI Patient presents with abdominal pain.  Reportedly has had pain for a while and known ventral hernia.  States been worse last few days.  Reviewing records appears least a week and a half ago she was having nausea and vomiting and some constipation.  Reportedly given MiraLAX by PCP.  States that just made her throw up more.  States really not passing gas.  Diffuse pain in the abdomen.  Denies blood in the emesis.  States that this is her hernia bothering her.  No alcohol use.  No fevers.  Reviewing records it appears as if she was due to have ventral hernia repair but she had been more confused so surgery was canceled.  Was supposed to been done by Dr. Bobbye Morton.    Past Medical History:  Diagnosis Date  . Anxiety and depression   . Arthritis   . Bowel obstruction ([place]) 12/2019  . CAD (coronary artery disease) 2014  . Cancer of kidney, right ([place])   . Colon cancer ([place])   . COPD (chronic obstructive pulmonary disease) ([place])   . CVA (cerebral vascular accident) ([place])   . Hypertension   . Myocardial infarction ([place]) 2015  . Pneumonia     Patient Active Problem List   Diagnosis Date Noted  . SBO (small bowel obstruction) ([place]) 01/06/2020  . Tobacco dependence 01/06/2020  . DNR (do not resuscitate) 01/06/2020  . Hypertension   . CVA (cerebral vascular accident) ([place])   . COPD (chronic obstructive pulmonary disease) ([place])   . Colon cancer ([place])   . Cancer of kidney, right ([place])   . Anxiety and depression   . CAD (coronary artery disease) 2014    Past Surgical History:  Procedure Laterality Date  . APPENDECTOMY    . CHOLECYSTECTOMY    . COLECTOMY    . CORONARY ANGIOPLASTY     BMS CX 2015 per Dr. Donnetta Hutching notes  . EYE SURGERY    . NEPHRECTOMY        OB History   No obstetric history on file.     No family history on file.  Social History   Tobacco Use  . Smoking status: Current Every Day Smoker    Packs/day: 0.50    Years: 60.00    Pack years: 30.00  . Smokeless tobacco: Never Used  Vaping Use  . Vaping Use: Never used  Substance Use Topics  . Alcohol use: Never  . Drug use: Never    Home Medications Prior to Admission medications   Medication Sig Start Date End Date Taking? Authorizing Provider  acetaminophen (TYLENOL) 500 MG tablet Take 1,000 mg by mouth every 6 (six) hours as needed for mild pain.   Yes [provider]  albuterol (VENTOLIN HFA) 108 (90 Base) MCG/ACT inhaler Inhale 2 puffs into the lungs every 6 (six) hours as needed for wheezing. 11/27/19  Yes [provider]  ALPRAZolam Duanne Moron) 0.25 MG tablet Take 0.25 mg by mouth at bedtime as needed for anxiety.   Yes [provider]  aspirin 81 MG EC tablet Take 81 mg by mouth daily. 10/09/16  Yes [provider]  atenolol (TENORMIN) 25 MG tablet Take 25 mg by mouth 2 (two) times daily. 12/21/19  Yes [provider]  Carboxymethylcellul-Glycerin (EQ LUBRICATING EYE DROPS OP) Place 1 drop into both eyes daily as needed (dry eyes).   Yes [provider]  Cyanocobalamin (B-12 PO) Take 1 tablet by mouth daily.   Yes [provider]  Docusate Calcium (STOOL SOFTENER PO) Take 1 tablet by mouth daily as needed (constipation).   Yes [provider]  nitroGLYCERIN (NITROSTAT) 0.4 MG SL tablet Place 0.4 mg under the tongue every 5 (five) minutes as needed for chest pain.  01/01/19  Yes [provider]  ondansetron (ZOFRAN-ODT) 4 MG disintegrating tablet Take 1 tablet (4 mg total) by mouth every 8 (eight) hours as needed for nausea or vomiting. 06/12/20  Yes Davonna Belling, MD  OXYGEN Inhale 2 L into the lungs as needed for shortness of breath.   Yes [provider]  predniSONE  (DELTASONE) 5 MG tablet Take 5 mg by mouth daily. 12/21/19  Yes [provider]    Allergies    Codeine, Erythromycin, Iodinated diagnostic agents, Other, Penicillins, Sulfa antibiotics, Influenza vaccines, Naproxen, Oxycodone-acetaminophen, and Pneumococcal vaccine  Review of Systems   Review of Systems  Constitutional: Positive for appetite change. Negative for unexpected weight change.  Respiratory: Negative for shortness of breath.   Gastrointestinal: Positive for abdominal pain, constipation, nausea and vomiting.  Genitourinary: Negative for flank pain.  Musculoskeletal: Negative for back pain.  Skin: Negative for pallor.  Neurological: Negative for weakness.  Psychiatric/Behavioral: Negative for confusion.    Physical Exam Updated Vital Signs BP 137/84   Pulse 66   Temp 98.5 F (36.9 C) (Oral)   Resp 13   Ht 5' (1.524 m)   Wt 52.6 kg   SpO2 97%   BMI 22.65 kg/m   Physical Exam Vitals and nursing note reviewed.  HENT:     Head: Atraumatic.  Eyes:     General: No scleral icterus. Cardiovascular:     Rate and Rhythm: Normal rate and regular rhythm.  Pulmonary:     Breath sounds: No wheezing or rhonchi.  Abdominal:     General: There is no distension.     Hernia: A hernia is present. Hernia is present in the ventral area.     Comments: Diffuse abdominal tenderness without severe distention.  Has ventral hernia on mid abdomen.  Approximately size of a racquetball and does reduce.  Skin:    General: Skin is warm.     Capillary Refill: Capillary refill takes less than 2 seconds.  Neurological:     Mental Status: She is alert.     Comments: Patient is awake and appears appropriate.     ED Results / Procedures / Treatments   Labs (all labs ordered are listed, but only abnormal results are displayed) Labs Reviewed  COMPREHENSIVE METABOLIC PANEL - Abnormal; Notable for the following components:      Result Value   Glucose, Bld 101 (*)    Albumin 3.4 (*)     AST 655 (*)    ALT 185 (*)    Alkaline Phosphatase 286 (*)    Total Bilirubin 1.5 (*)    All other components within normal limits  CBC - Abnormal; Notable for the following components:   WBC 11.1 (*)    All other components within normal limits  URINALYSIS, ROUTINE W REFLEX MICROSCOPIC - Abnormal; Notable for the following components:   Color, Urine AMBER (*)    APPearance HAZY (*)    All other components within normal limits  ACETAMINOPHEN LEVEL - Abnormal; Notable  for the following components:   Acetaminophen (Tylenol), Serum <10 (*)    All other components within normal limits  RESP PANEL BY RT-PCR (FLU A&B, COVID) ARPGX2  LIPASE, BLOOD  PROTIME-INR  HEPATITIS PANEL, ACUTE    EKG None  Radiology CT ABDOMEN PELVIS WO CONTRAST  Result Date: 06/12/2020 CLINICAL DATA:  75 year old female with abdominal pain and vomiting. EXAM: CT ABDOMEN AND PELVIS WITHOUT CONTRAST TECHNIQUE: Multidetector CT imaging of the abdomen and pelvis was performed following the standard protocol without IV contrast. COMPARISON:  CT abdomen pelvis dated 01/06/2020. FINDINGS: Evaluation of this exam is limited in the absence of intravenous contrast. Lower chest: The visualized lung bases are clear. There is coronary vascular calcification. No intra-abdominal free air or free fluid. Hepatobiliary: There is a 6 cm cyst in the right lobe of the liver. There is an ill-defined lesion in the right lobe of the liver (segment VIII/V) measuring approximately 3.2 x 2.5 cm (22/3). This is not characterized on this CT. Further evaluation with MRI without and with contrast on a nonemergent/outpatient basis recommended. No intrahepatic biliary ductal dilatation. Cholecystectomy. Pancreas: The pancreas is grossly unremarkable. Focal ill-defined prominence of the uncinate process of the pancreas, likely confluent of ducts and vasculature. This is similar to prior CT. Attention on MRI recommended. Spleen: Normal in size without  focal abnormality. Adrenals/Urinary Tract: Bilateral adrenal thickening/hyperplasia versus adenoma measuring up to 15 mm in thickness on the left. Status post prior right nephrectomy. No soft tissue mass noted in the nephrectomy bed. The left kidney, visualized left ureter, appear unremarkable. The urinary bladder is predominantly collapsed. Stomach/Bowel: There is herniation of a segment of small bowel through a 5 cm ventral peritoneal defect in the midline without evidence of obstruction. There is moderate stool throughout the colon. Postsurgical changes in the region of the ascending colon. No bowel obstruction or active inflammation. Appendectomy. Vascular/Lymphatic: Advanced aortoiliac atherosclerotic disease. The IVC is unremarkable. No portal venous gas. There is no adenopathy. Reproductive: The uterus is grossly unremarkable. No adnexal masses. Other: Probable prior ventral hernia repair with recurrence. Musculoskeletal: Osteopenia.  No acute osseous pathology. IMPRESSION: 1. No acute intra-abdominal or pelvic pathology. 2. A 5 cm ventral hernia containing a segment of small bowel without evidence of obstruction. 3. Status post prior right nephrectomy. No soft tissue mass in the nephrectomy bed. 4. Ill-defined lesion in the right lobe of the liver, not characterized. Further evaluation with MRI without and with contrast on a nonemergent/outpatient basis recommended. 5. Aortic Atherosclerosis (ICD10-I70.0). Electronically Signed   By: Anner Crete M.D.   On: 06/12/2020 16:24   US Abdomen Limited  Result Date: 06/12/2020 CLINICAL DATA:  75 year old female with elevated LFTs. History of cholecystectomy. EXAM: ULTRASOUND ABDOMEN LIMITED RIGHT UPPER QUADRANT COMPARISON:  01/06/2020 CT and prior studies FINDINGS: Gallbladder: Not visualized compatible with cholecystectomy. Common bile duct: Diameter: 4 mm. No evidence of intrahepatic or extrahepatic biliary dilatation. Liver: Slightly increased hepatic  echogenicity may reflect mild hepatic steatosis. A 6.5 cm RIGHT hepatic cyst is again identified. No suspicious focal hepatic lesions are identified. Portal vein is patent on color Doppler imaging with normal direction of blood flow towards the liver. Other: None. IMPRESSION: 1. No evidence of acute abnormality. No biliary dilatation. 2. Question mild hepatic steatosis. Electronically Signed   By: Margarette Canada M.D.   On: 06/12/2020 10:09   DG Chest Portable 1 View  Result Date: 06/12/2020 CLINICAL DATA:  Vomiting. EXAM: PORTABLE CHEST 1 VIEW COMPARISON:  Single-view of the chest  09/24/2018. FINDINGS: Lungs clear. Heart size normal. Aortic atherosclerosis. No pneumothorax or pleural fluid. No acute or focal bony abnormality. IMPRESSION: No acute disease. Aortic Atherosclerosis (ICD10-I70.0). Electronically Signed   By: Inge Rise M.D.   On: 06/12/2020 16:33    Procedures Procedures   Medications Ordered in ED Medications  ondansetron (ZOFRAN) injection 4 mg (4 mg Intravenous Given 06/12/20 1629)    ED Course  I have reviewed the triage vital signs and the nursing notes.  Pertinent labs & imaging results that were available during my care of the patient were reviewed by me and considered in my medical decision making (see chart for details).    MDM Rules/Calculators/A&P                         Patient presents with abdominal pain nausea and vomiting.  Some difficulty having bowel movements.  History of ventral hernia.  Previously was going to have surgery but then delayed due to some delirium.  Is post follow-up with Dr. Bobbye Morton next month.  States she has had some vomiting.  States to me it was just yesterday but reviewing records appears to have been prior to this.  Patient states she is feeling better now.  There is a new elevation of her liver function however.  Ultrasound done and reassuring.  CT scan done and showed a ventral hernia which does reproduce.  No obstruction seen.  However  there may be a new lesion in the liver.  Does have previous cancer history.  Discussed with patient about admission versus discharge.  Patient states she is feeling better and feels that she drink.  Will discharge home with some nausea medicine.  Talked to patient about reducing her own hernia.  Can follow-up with PCP and her surgeon.  States she will call the surgeon to see if she can get seen sooner.  Will discharge home.  Does not appear to have an obstruction at this time Final Clinical Impression(s) / ED Diagnoses Final diagnoses:  Ventral hernia without obstruction or gangrene  Abnormal liver enzymes    Rx / DC Orders ED Discharge Orders         Ordered    ondansetron (ZOFRAN-ODT) 4 MG disintegrating tablet  Every 8 hours PRN        06/12/20 1704           Davonna Belling, MD 06/12/20 1752

## 2020-06-12 NOTE — ED Triage Notes (Signed)
[facility] ems from home c/o epigastric pain States she has had pain for 1 year pian got a little worse today.

## 2020-08-06 ENCOUNTER — Encounter: Payer: Self-pay | Admitting: General Surgery

## 2020-08-07 ENCOUNTER — Other Ambulatory Visit: Payer: Self-pay | Admitting: Surgery

## 2020-08-07 DIAGNOSIS — K5651 Intestinal adhesions [bands], with partial obstruction: Secondary | ICD-10-CM

## 2020-08-09 ENCOUNTER — Other Ambulatory Visit: Payer: Self-pay | Admitting: Surgery

## 2020-08-09 ENCOUNTER — Other Ambulatory Visit (HOSPITAL_COMMUNITY): Payer: Self-pay | Admitting: Surgery

## 2020-08-16 ENCOUNTER — Ambulatory Visit (HOSPITAL_COMMUNITY): Admission: RE | Admit: 2020-08-16 | Payer: Medicare Other | Source: Ambulatory Visit

## 2020-08-16 ENCOUNTER — Ambulatory Visit (HOSPITAL_COMMUNITY)
Admission: RE | Admit: 2020-08-16 | Discharge: 2020-08-16 | Disposition: A | Discharge status: Home / Self Care | Payer: Medicare Other | Source: Ambulatory Visit | Attending: Surgery | Admitting: Surgery

## 2020-08-16 ENCOUNTER — Other Ambulatory Visit: Payer: Self-pay

## 2020-08-16 DIAGNOSIS — K5651 Intestinal adhesions [bands], with partial obstruction: Secondary | ICD-10-CM | POA: Insufficient documentation

## 2020-09-15 NOTE — Addendum Note (Signed)
Encounter addended by: Annie Paras on: 09/15/2020 7:23 PM  Actions taken: Letter saved

## 2021-06-15 IMAGING — CT CT ABD-PELV W/O CM
2 of 4 series · 15 of 46 positions shown, 17 images · non-contrast
Comparison: CT abdomen pelvis dated 01/06/2020.

CLINICAL DATA: 74-year-old female with abdominal pain and vomiting.

EXAM:
CT ABDOMEN AND PELVIS WITHOUT CONTRAST
TECHNIQUE: Multidetector CT imaging of the abdomen and pelvis was performed
following the standard protocol without IV contrast.

[Series 3: ap without · axial · non-contrast · 0.68mm/px · z∈[+1053,+1428]mm · 12 of 86 slices shown, 14 images]
[im 6/86  soft-tissue]
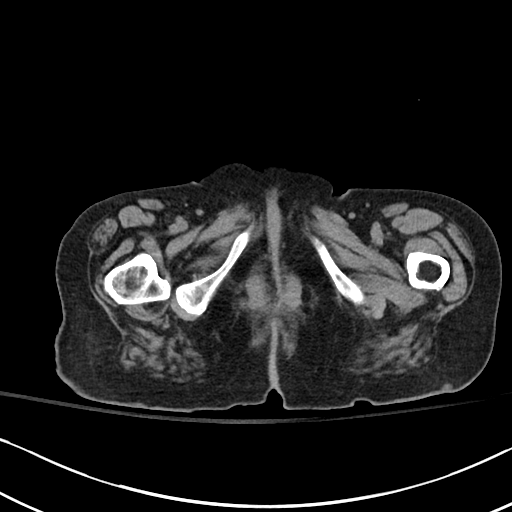
[im 6/86  bone]
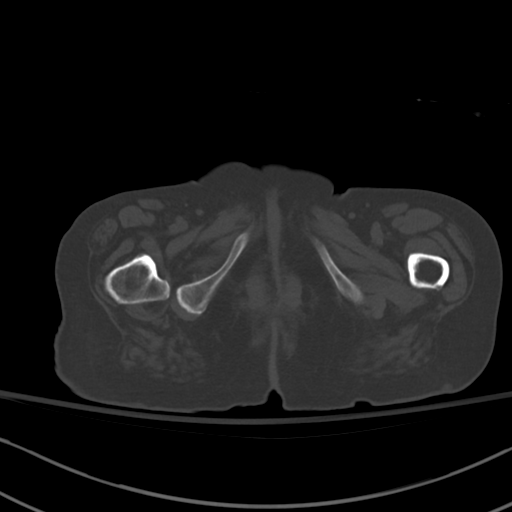
[im 16/86  soft-tissue]
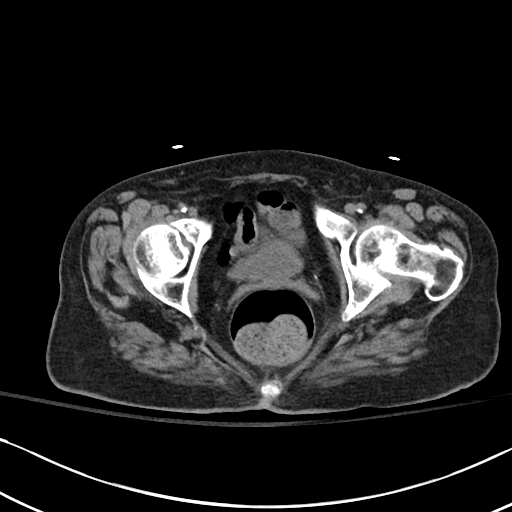
[im 21/86  soft-tissue]
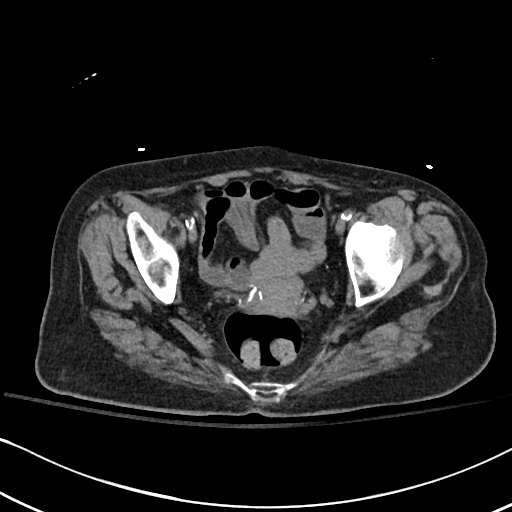
[im 26/86  soft-tissue]
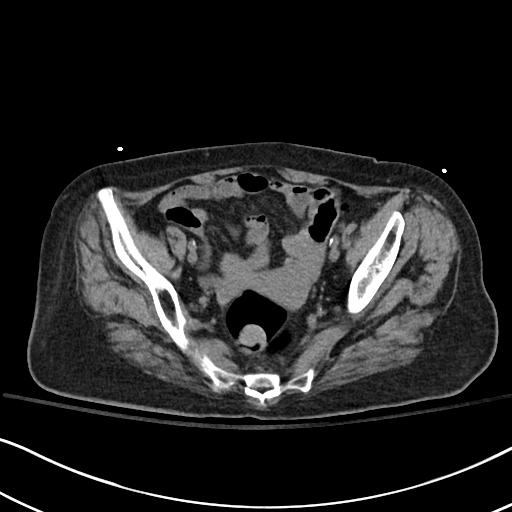
[im 36/86  soft-tissue]
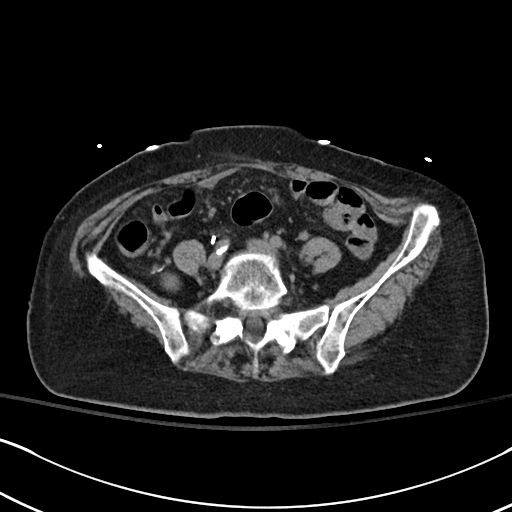
[im 41/86  soft-tissue]
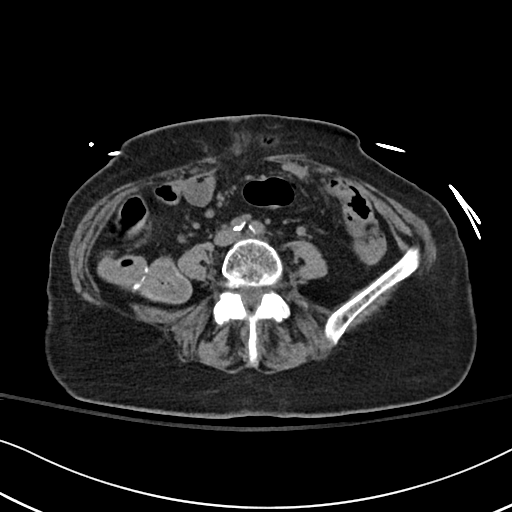
[im 46/86  soft-tissue]
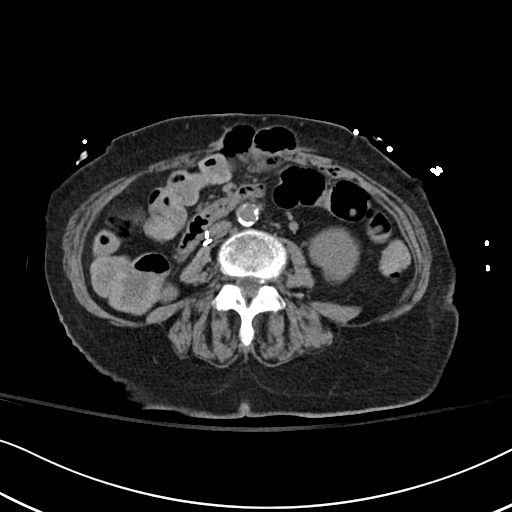
[im 56/86  soft-tissue]
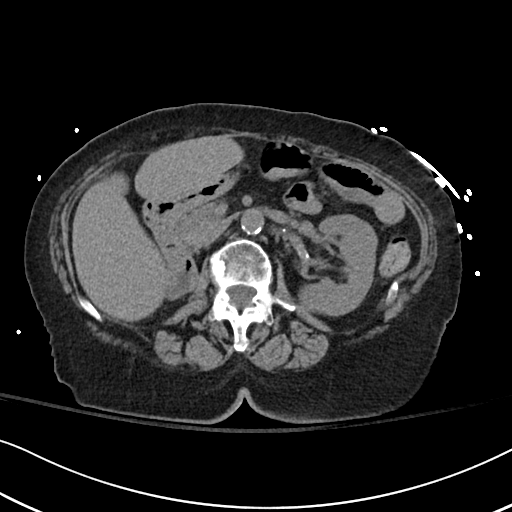
[im 61/86  soft-tissue]
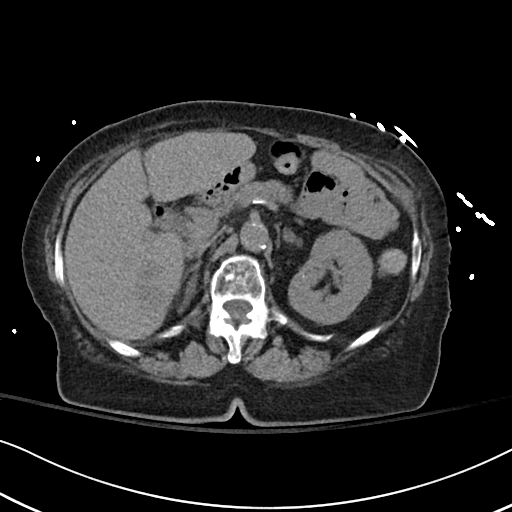
[im 61/86  bone]
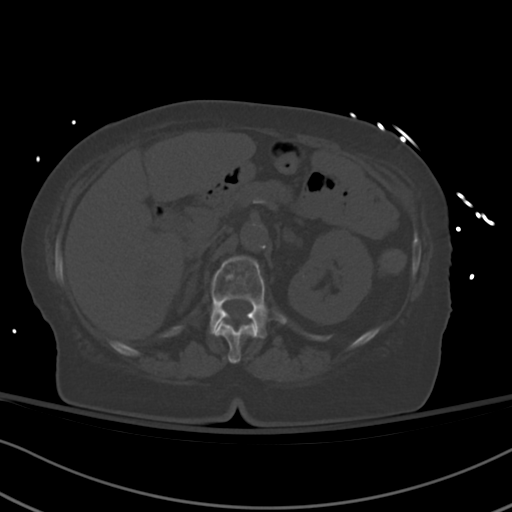
[im 66/86  soft-tissue]
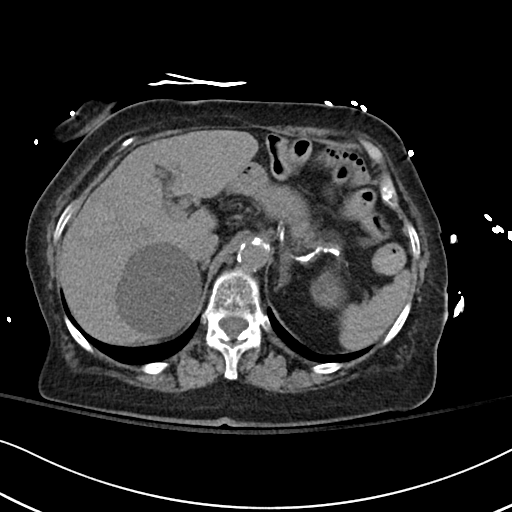
[im 76/86  soft-tissue]
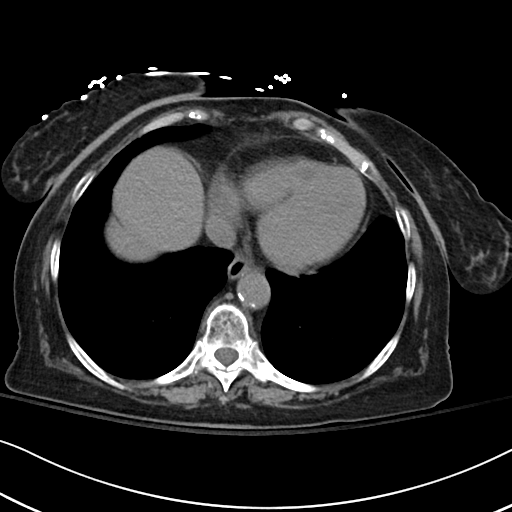
[im 81/86  soft-tissue]
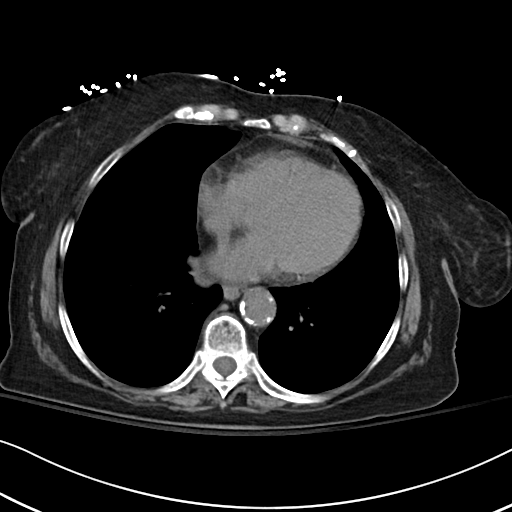

[Series 6: cor · coronal · 0.70mm/px · 3 of 76 slices shown]
[im 26/76  soft-tissue]
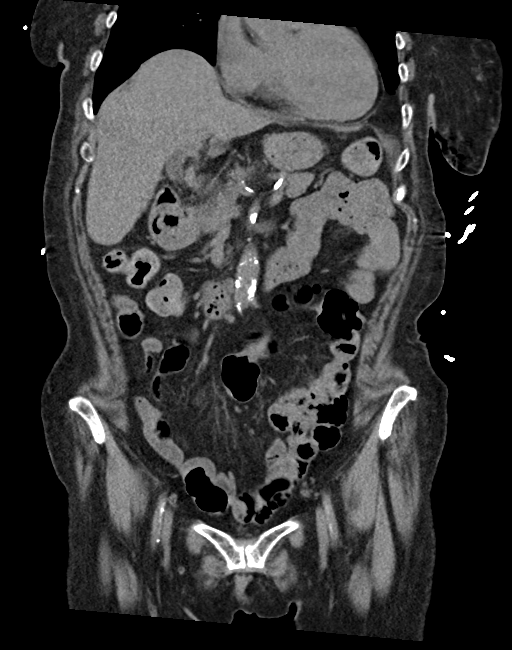
[im 34/76  soft-tissue]
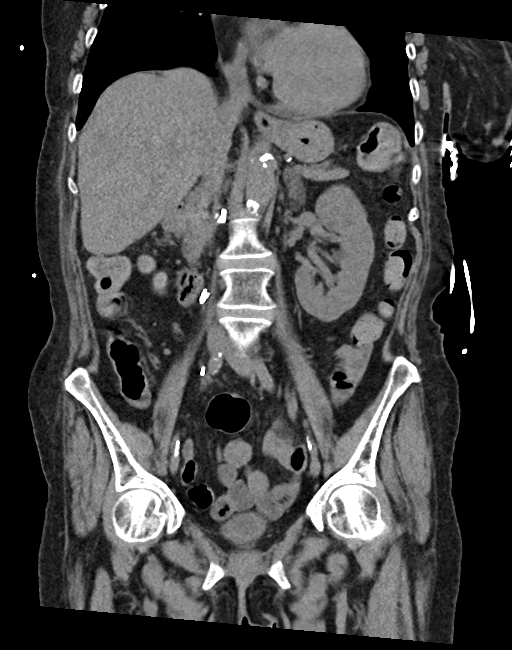
[im 42/76  soft-tissue]
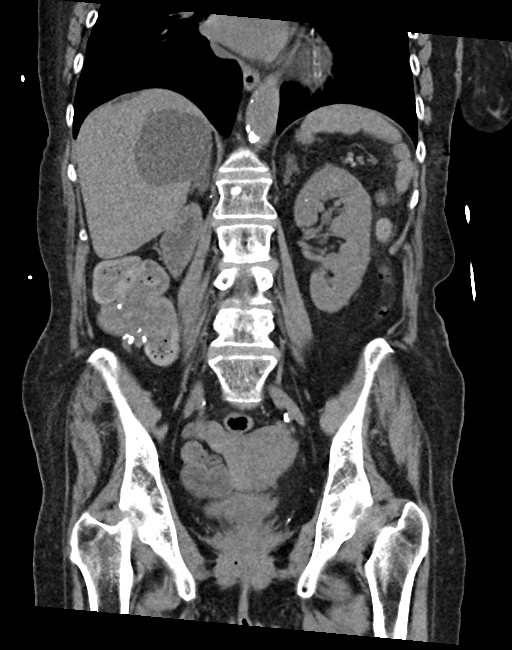

[15 of 46 positions shown; findings below may reference images not displayed]

FINDINGS: Evaluation of this exam is limited in the absence of intravenous
contrast.

Lower chest: The visualized lung bases are clear. There is coronary
vascular calcification.

No intra-abdominal free air or free fluid.

Hepatobiliary: There is a 6 cm cyst in the right lobe of the liver.
There is an ill-defined lesion in the right lobe of the liver
(segment VIII/V) measuring approximately 3.2 x 2.5 cm ([DATE]). This
is not characterized on this CT. Further evaluation with MRI without
and with contrast on a nonemergent/outpatient basis recommended. No
intrahepatic biliary ductal dilatation. Cholecystectomy.

Pancreas: The pancreas is grossly unremarkable. Focal ill-defined
prominence of the uncinate process of the pancreas, likely confluent
of ducts and vasculature. This is similar to prior CT. Attention on
MRI recommended.

Spleen: Normal in size without focal abnormality.

Adrenals/Urinary Tract: Bilateral adrenal thickening/hyperplasia
versus adenoma measuring up to 15 mm in thickness on the left.
Status post prior right nephrectomy. No soft tissue mass noted in
the nephrectomy bed. The left kidney, visualized left ureter, appear
unremarkable. The urinary bladder is predominantly collapsed.

Stomach/Bowel: There is herniation of a segment of small bowel
through a 5 cm ventral peritoneal defect in the midline without
evidence of obstruction. There is moderate stool throughout the
colon. Postsurgical changes in the region of the ascending colon. No
bowel obstruction or active inflammation. Appendectomy.

Vascular/Lymphatic: Advanced aortoiliac atherosclerotic disease. The
IVC is unremarkable. No portal venous gas. There is no adenopathy.

Reproductive: The uterus is grossly unremarkable. No adnexal masses.

Other: Probable prior ventral hernia repair with recurrence.

Musculoskeletal: Osteopenia.  No acute osseous pathology.
IMPRESSION: 1. No acute intra-abdominal or pelvic pathology.
2. A 5 cm ventral hernia containing a segment of small bowel without
evidence of obstruction.
3. Status post prior right nephrectomy. No soft tissue mass in the
nephrectomy bed.
4. Ill-defined lesion in the right lobe of the liver, not
characterized. Further evaluation with MRI without and with contrast
on a nonemergent/outpatient basis recommended.
5. Aortic Atherosclerosis ([id number]).

## 2021-10-21 DEATH — deceased
